# Patient Record
Sex: Female | Born: 1970
Health system: Southern US, Community
[De-identification: ages and names within clinical notes are randomized; demographics above are authoritative.]

## PROBLEM LIST (undated history)

## (undated) DIAGNOSIS — K5900 Constipation, unspecified: Secondary | ICD-10-CM

## (undated) DIAGNOSIS — I10 Essential (primary) hypertension: Secondary | ICD-10-CM

## (undated) DIAGNOSIS — Z5189 Encounter for other specified aftercare: Secondary | ICD-10-CM

## (undated) DIAGNOSIS — M2550 Pain in unspecified joint: Secondary | ICD-10-CM

## (undated) HISTORY — PX: MYOMECTOMY: SHX85

## (undated) HISTORY — DX: Essential (primary) hypertension: I10

## (undated) HISTORY — DX: Pain in unspecified joint: M25.50

## (undated) HISTORY — PX: PARTIAL HYSTERECTOMY: SHX80

## (undated) HISTORY — DX: Constipation, unspecified: K59.00

## (undated) HISTORY — DX: Encounter for other specified aftercare: Z51.89

## (undated) HISTORY — PX: ABDOMINAL HYSTERECTOMY: SHX81

---

## 2016-01-18 ENCOUNTER — Encounter: Payer: Self-pay | Admitting: Obstetrics and Gynecology

## 2019-04-07 ENCOUNTER — Telehealth: Payer: Self-pay

## 2019-04-07 NOTE — Telephone Encounter (Signed)
Message from answering service-Pt bp 160/100. Pt unsure why. Please advise.//ah

## 2019-04-07 NOTE — Telephone Encounter (Signed)
She is new and I left message to front to schedule visit

## 2019-04-18 ENCOUNTER — Encounter: Payer: Self-pay | Admitting: Cardiology

## 2019-04-18 ENCOUNTER — Ambulatory Visit (INDEPENDENT_AMBULATORY_CARE_PROVIDER_SITE_OTHER): Payer: 59 | Admitting: Cardiology

## 2019-04-18 ENCOUNTER — Other Ambulatory Visit: Payer: Self-pay

## 2019-04-18 VITALS — BP 168/106 | HR 77 | Ht 66.5 in | Wt 225.6 lb

## 2019-04-18 DIAGNOSIS — Z6835 Body mass index (BMI) 35.0-35.9, adult: Secondary | ICD-10-CM

## 2019-04-18 DIAGNOSIS — I10 Essential (primary) hypertension: Secondary | ICD-10-CM | POA: Diagnosis not present

## 2019-04-18 DIAGNOSIS — E6609 Other obesity due to excess calories: Secondary | ICD-10-CM

## 2019-04-18 MED ORDER — CHLORTHALIDONE 25 MG PO TABS: 25.0000 mg | ORAL_TABLET | ORAL | 2 refills | Status: DC

## 2019-04-18 MED ORDER — AMLODIPINE BESYLATE 10 MG PO TABS: 10.0000 mg | ORAL_TABLET | Freq: Every day | ORAL | 2 refills | Status: DC

## 2019-04-18 NOTE — Progress Notes (Signed)
Primary Physician/Referring:  Leilani Able, MD  Patient ID: Amanda Stevens, female    DOB: 09/25/1970, 48 y.o.   MRN: [id number]  Chief Complaint  Patient presents with  . Hypertension   HPI:    Amanda Stevens  is a 48 y.o. African-American female with hypertension,At age 48 had hysterectomy, 4 years following which she started gaining weight and also started noticing blood pressure to be elevated.  Recently her blood pressure has not been well-controlled and much more elevated than before, has also noticed fatigue and also occasional episodes of headache with elevated blood pressure.  She is now referred to me for evaluation of the same.  She continues to exercise, is trying her best to watch the diet and still has difficulty in losing weight.  She has no chest pain, dyspnea, palpitations, leg edema.  Past Medical History:  Diagnosis Date  . Hypertension    Past Surgical History:  Procedure Laterality Date  . PARTIAL HYSTERECTOMY     Social History   Socioeconomic History  . Marital status: Single    Spouse name: Not on file  . Number of children: Not on file  . Years of education: Not on file  . Highest education level: Not on file  Occupational History  . Not on file  Social Needs  . Financial resource strain: Not on file  . Food insecurity    Worry: Not on file    Inability: Not on file  . Transportation needs    Medical: Not on file    Non-medical: Not on file  Tobacco Use  . Smoking status: Never Smoker  . Smokeless tobacco: Never Used  Substance and Sexual Activity  . Alcohol use: Not Currently  . Drug use: Not on file  . Sexual activity: Not on file  Lifestyle  . Physical activity    Days per week: Not on file    Minutes per session: Not on file  . Stress: Not on file  Relationships  . Social Musician on phone: Not on file    Gets together: Not on file    Attends religious service: Not on file    Active member of club or organization: Not  on file    Attends meetings of clubs or organizations: Not on file    Relationship status: Not on file  . Intimate partner violence    Fear of current or ex partner: Not on file    Emotionally abused: Not on file    Physically abused: Not on file    Forced sexual activity: Not on file  Other Topics Concern  . Not on file  Social History Narrative  . Not on file   ROS  Review of Systems  Constitution: Negative for chills, decreased appetite, malaise/fatigue and weight gain.  Cardiovascular: Negative for dyspnea on exertion, leg swelling and syncope.  Respiratory: Positive for snoring (no other sympotms of sleep apnea).   Endocrine: Negative for cold intolerance.  Hematologic/Lymphatic: Does not bruise/bleed easily.  Musculoskeletal: Negative for joint swelling.  Gastrointestinal: Negative for abdominal pain, anorexia, change in bowel habit, hematochezia and melena.  Neurological: Positive for headaches (occasionally with elevated BP). Negative for light-headedness.  Psychiatric/Behavioral: Negative for depression and substance abuse.  All other systems reviewed and are negative.  Objective   Vitals with BMI 04/18/2019  Height 5' 6.5"  Weight 225 lbs 10 oz  BMI 35.87  Systolic 168  Diastolic 106  Pulse 77     Physical  Exam  Constitutional:  She is well-developed and moderately obese in no acute distress.  HENT:  Head: Atraumatic.  Eyes: Conjunctivae are normal.  Neck: Neck supple. No JVD present. No thyromegaly present.  Cardiovascular: Normal rate, regular rhythm, normal heart sounds and intact distal pulses. Exam reveals no gallop.  No murmur heard. No leg edema, no JVD.  Pulmonary/Chest: Effort normal and breath sounds normal.  Abdominal: Soft. Bowel sounds are normal.  Musculoskeletal: Normal range of motion.  Neurological: She is alert.  Skin: Skin is warm and dry.  Psychiatric: She has a normal mood and affect.   Laboratory examination:    No results for  input(s): NA, K, CL, CO2, GLUCOSE, BUN, CREATININE, CALCIUM, GFRNONAA, GFRAA in the last 8760 hours. CrCl cannot be calculated (No successful lab value found.).  No flowsheet data found. No flowsheet data found. Lipid Panel  No results found for: CHOL, TRIG, HDL, CHOLHDL, VLDL, LDLCALC, LDLDIRECT HEMOGLOBIN A1C No results found for: HGBA1C, MPG TSH No results for input(s): TSH in the last 8760 hours. Medications and allergies  No Known Allergies   Current Outpatient Medications  Medication Instructions  . amLODipine (NORVASC) 10 mg, Oral, Daily  . Black Currant Seed Oil 500 MG CAPS Oral  . chlorthalidone (HYGROTON) 25 mg, Oral, BH-each morning  . EDARBI 80 MG TABS 1 tablet, Oral, Daily  . multivitamin-iron-minerals-folic acid (CENTRUM) chewable tablet 1 tablet, Oral, Daily  . Omega-3 1000 MG CAPS Oral  . Progesterone Micronized (PROGESTERONE COMPOUNDING KIT) 20 % CREA Transdermal    Radiology:  No results found. Cardiac Studies:   None  Assessment     ICD-10-CM   1. Essential hypertension  I10 EKG 12-Lead    amLODipine (NORVASC) 10 MG tablet    PCV ECHOCARDIOGRAM COMPLETE  2. Class 2 obesity due to excess calories without serious comorbidity with body mass index (BMI) of 35.0 to 35.9 in adult  E66.09    Z68.35     EKG 04/18/2019: Normal sinus rhythm at rate of 71 bpm, left atrial enlargement, otherwise normal EKG.    Recommendations:   Meds ordered this encounter  Medications  . amLODipine (NORVASC) 10 MG tablet    Sig: Take 1 tablet (10 mg total) by mouth daily.    Dispense:  30 tablet    Refill:  2  . chlorthalidone (HYGROTON) 25 MG tablet    Sig: Take 1 tablet (25 mg total) by mouth every morning.    Dispense:  30 tablet    Refill:  2    Patient referred to me by Dr. Haskel Schroeder for evaluation of hypertension.  Patient's blood pressure issues started after hysterectomy, she is also gained significant amount of weight since then over the past 5 years or so.   Her symptoms of headaches and fatigue are related to uncontrolled hypertension.  Advised her to continue [place] but I have added chlorthalidone 25 mg once a day in the morning along with amlodipine 10 mg in the afternoon.  We will obtain an echocardiogram to evaluate LV systolic function and to look for LVH.  She has got moderate obesity.  I had a lengthy and extensive discussion regarding modification in diet, DASH diet discussed, Vegan diet also discussed.  I'll obtain a BMP in 2 weeks.  I'll like to see her back in 6 weeks for follow-up.  She'll also bring me all the labs from her PCP including lipids that was done at the workplace. She will certainly need lipid profile testing and  TSH if not recently done.  Yates Decamp, MD, [facility] 04/18/2019, 2:04 PM [facility] Pager: [phone] Office: [phone] If no answer Cell [phone]

## 2019-04-18 NOTE — Patient Instructions (Signed)
Please have blood work done in 2 weeks, go to any [facility], orders were already in.  2 medications have been started, chlorthalidone 25 mg in the morning once a day and amlodipine 10 mg one tablet once a day.  Echocardiogram has been ordered and I'll see her back in 6 weeks.  Please follow with diet recommendations are made.

## 2019-05-12 ENCOUNTER — Telehealth: Payer: Self-pay | Admitting: Cardiology

## 2019-05-12 ENCOUNTER — Other Ambulatory Visit: Payer: 59

## 2019-05-19 ENCOUNTER — Telehealth: Payer: Self-pay

## 2019-05-19 NOTE — Telephone Encounter (Signed)
Patient called and said that she is having significant fatigue, she believes it is from the Amlodipine. She takes all 3 of her BP meds at the same time in the morning. She also has some swelling in her feet.

## 2019-05-19 NOTE — Telephone Encounter (Signed)
She can split taking medication and also to reduce Amlodipine to 1/2 and take in evening

## 2019-05-26 ENCOUNTER — Ambulatory Visit: Payer: 59 | Admitting: Cardiology

## 2019-06-09 ENCOUNTER — Other Ambulatory Visit: Payer: 59

## 2019-07-09 ENCOUNTER — Other Ambulatory Visit: Payer: Self-pay

## 2019-07-09 DIAGNOSIS — I10 Essential (primary) hypertension: Secondary | ICD-10-CM

## 2019-07-09 MED ORDER — CHLORTHALIDONE 25 MG PO TABS: 25.0000 mg | ORAL_TABLET | ORAL | 1 refills | Status: DC

## 2019-07-09 MED ORDER — AMLODIPINE BESYLATE 10 MG PO TABS: 10.0000 mg | ORAL_TABLET | Freq: Every day | ORAL | 1 refills | Status: DC

## 2019-09-01 ENCOUNTER — Ambulatory Visit: Payer: 59 | Admitting: Cardiology

## 2019-09-01 ENCOUNTER — Encounter: Payer: Self-pay | Admitting: Cardiology

## 2019-09-01 ENCOUNTER — Other Ambulatory Visit: Payer: Self-pay

## 2019-09-01 VITALS — BP 126/77 | HR 97 | Temp 98.2°F | Resp 18 | Ht 66.0 in | Wt 237.0 lb

## 2019-09-01 DIAGNOSIS — I10 Essential (primary) hypertension: Secondary | ICD-10-CM

## 2019-09-01 NOTE — Progress Notes (Signed)
Primary Physician/Referring:  Leilani Able, MD  Patient ID: Amanda Stevens, female    DOB: 1970/10/13, 49 y.o.   MRN: [id number]  Chief Complaint  Patient presents with  . Hypertension  . Follow-up   HPI:    Amanda Stevens  is a 49 y.o. African-American female with hypertension,At age 49 had hysterectomy, 49 years following which she started gaining weight and also started noticing blood pressure to be elevated.  Due to difficulty in blood pressure control she was referred back to me, I had started on chlorthalidone and also amlodipine which she is tolerating.  She now presents for follow-up and remains asymptomatic.  She has noticed significant improvement in blood pressure.  Past Medical History:  Diagnosis Date  . Hypertension    Past Surgical History:  Procedure Laterality Date  . PARTIAL HYSTERECTOMY     Social History   Socioeconomic History  . Marital status: Single    Spouse name: Not on file  . Number of children: Not on file  . Years of education: Not on file  . Highest education level: Not on file  Occupational History  . Not on file  Tobacco Use  . Smoking status: Never Smoker  . Smokeless tobacco: Never Used  Substance and Sexual Activity  . Alcohol use: Not Currently  . Drug use: Not on file  . Sexual activity: Not on file  Other Topics Concern  . Not on file  Social History Narrative  . Not on file   Social Determinants of Health   Financial Resource Strain:   . Difficulty of Paying Living Expenses:   Food Insecurity:   . Worried About Programme researcher, broadcasting/film/video in the Last Year:   . Barista in the Last Year:   Transportation Needs:   . Freight forwarder (Medical):   Marland Kitchen Lack of Transportation (Non-Medical):   Physical Activity:   . Days of Exercise per Week:   . Minutes of Exercise per Session:   Stress:   . Feeling of Stress :   Social Connections:   . Frequency of Communication with Friends and Family:   . Frequency of Social  Gatherings with Friends and Family:   . Attends Religious Services:   . Active Member of Clubs or Organizations:   . Attends Banker Meetings:   Marland Kitchen Marital Status:   Intimate Partner Violence:   . Fear of Current or Ex-Partner:   . Emotionally Abused:   Marland Kitchen Physically Abused:   . Sexually Abused:    ROS  Review of Systems  Cardiovascular: Negative for chest pain, dyspnea on exertion and leg swelling.  Gastrointestinal: Negative for melena.   Objective   Vitals with BMI 09/01/2019 04/18/2019  Height 5\' 6"  5' 6.5"  Weight 237 lbs 225 lbs 10 oz  BMI 38.27 35.87  Systolic 126 168  Diastolic 77 106  Pulse 97 77     Physical Exam  Constitutional:  She is well-developed and moderately obese in no acute distress.  Neck: No JVD present. No thyromegaly present.  Cardiovascular: Normal rate, regular rhythm, normal heart sounds and intact distal pulses. Exam reveals no gallop.  No murmur heard. No leg edema, no JVD.  Pulmonary/Chest: Effort normal and breath sounds normal.  Abdominal: Soft. Bowel sounds are normal.  Musculoskeletal:     Cervical back: Neck supple.   Laboratory examination:   No results for input(s): NA, K, CL, CO2, GLUCOSE, BUN, CREATININE, CALCIUM, GFRNONAA, GFRAA in the last 8760  hours. CrCl cannot be calculated (No successful lab value found.).  No flowsheet data found. No flowsheet data found. Lipid Panel  No results found for: CHOL, TRIG, HDL, CHOLHDL, VLDL, LDLCALC, LDLDIRECT HEMOGLOBIN A1C No results found for: HGBA1C, MPG TSH No results for input(s): TSH in the last 8760 hours. Medications and allergies  No Known Allergies   Current Outpatient Medications  Medication Instructions  . amLODipine (NORVASC) 10 mg, Oral, Daily  . Black Currant Seed Oil 500 MG CAPS Oral  . chlorthalidone (HYGROTON) 25 mg, Oral, BH-each morning  . EDARBI 80 MG TABS 1 tablet, Oral, Daily  . multivitamin-iron-minerals-folic acid (CENTRUM) chewable tablet 1  tablet, Oral, Daily  . Omega-3 1000 MG CAPS Oral  . Progesterone Micronized (PROGESTERONE COMPOUNDING KIT) 20 % CREA Transdermal    Radiology:  No results found. Cardiac Studies:   None  Assessment     ICD-10-CM   1. Essential hypertension  I10     EKG 04/18/2019: Normal sinus rhythm at rate of 71 bpm, left atrial enlargement, otherwise normal EKG.    Recommendations:   No orders of the defined types were placed in this encounter.   Patient referred to me by Dr. Haskel Schroeder for evaluation of hypertension.  Since addition of amlodipine and also chlorthalidone to her [name], patient blood pressure is not very well controlled, she is also made significant lifestyle changes and has started to lose weight.  No change in his physical exam.  I did order an echocardiogram she has not kept of the appointment for the same, in view of the fact that she is asymptomatic and blood pressures well controlled we will cancel this.  I will see her back on a as needed basis.  Positive reinforcement given regarding weight loss.  Advised her to follow-up with Dr. Haskel Schroeder for further management and evaluation of blood pressure and primary care needs.  Yates Decamp, MD, [facility] 09/01/2019, 3:47 PM [facility] Pager: [phone] Office: [phone] If no answer Cell [phone]

## 2020-01-02 ENCOUNTER — Other Ambulatory Visit: Payer: Self-pay | Admitting: Cardiology

## 2020-03-04 ENCOUNTER — Other Ambulatory Visit: Payer: Self-pay

## 2020-03-04 DIAGNOSIS — I10 Essential (primary) hypertension: Secondary | ICD-10-CM

## 2020-03-04 MED ORDER — CHLORTHALIDONE 25 MG PO TABS: 25.0000 mg | ORAL_TABLET | Freq: Every day | ORAL | 1 refills | Status: DC

## 2020-03-04 MED ORDER — AMLODIPINE BESYLATE 10 MG PO TABS: 10.0000 mg | ORAL_TABLET | Freq: Every day | ORAL | 1 refills | Status: DC

## 2020-03-18 ENCOUNTER — Ambulatory Visit
Admission: RE | Admit: 2020-03-18 | Discharge: 2020-03-18 | Disposition: A | Discharge status: Home / Self Care | Payer: 59 | Source: Ambulatory Visit | Attending: Family Medicine | Admitting: Family Medicine

## 2020-03-18 ENCOUNTER — Other Ambulatory Visit: Payer: Self-pay | Admitting: Family Medicine

## 2020-03-18 DIAGNOSIS — M545 Low back pain, unspecified: Secondary | ICD-10-CM

## 2020-07-13 ENCOUNTER — Other Ambulatory Visit: Payer: Self-pay | Admitting: Gastroenterology

## 2020-07-13 DIAGNOSIS — R1032 Left lower quadrant pain: Secondary | ICD-10-CM

## 2020-07-16 ENCOUNTER — Ambulatory Visit
Admission: RE | Admit: 2020-07-16 | Discharge: 2020-07-16 | Disposition: A | Discharge status: Home / Self Care | Payer: 59 | Source: Ambulatory Visit | Attending: Gastroenterology | Admitting: Gastroenterology

## 2020-07-16 ENCOUNTER — Other Ambulatory Visit: Payer: Self-pay

## 2020-07-16 DIAGNOSIS — R1032 Left lower quadrant pain: Secondary | ICD-10-CM

## 2020-07-16 MED ORDER — IOPAMIDOL (ISOVUE-300) INJECTION 61%
100.0000 mL | Freq: Once | INTRAVENOUS | Status: AC | PRN
  Administered 2020-07-16: 100 mL via INTRAVENOUS

## 2020-07-21 DIAGNOSIS — N951 Menopausal and female climacteric states: Secondary | ICD-10-CM | POA: Insufficient documentation

## 2020-07-21 DIAGNOSIS — Z90710 Acquired absence of both cervix and uterus: Secondary | ICD-10-CM | POA: Insufficient documentation

## 2020-07-21 DIAGNOSIS — R1032 Left lower quadrant pain: Secondary | ICD-10-CM | POA: Insufficient documentation

## 2020-07-28 ENCOUNTER — Other Ambulatory Visit: Payer: 59

## 2020-12-08 IMAGING — CR DG LUMBAR SPINE COMPLETE 4+V
5 series · 5 of 5 positions shown · non-contrast
Comparison: None.

CLINICAL DATA: Low back and right leg pain without known injury.

EXAM:
LUMBAR SPINE - COMPLETE 4+ VIEW

[t l-spine a.p.]
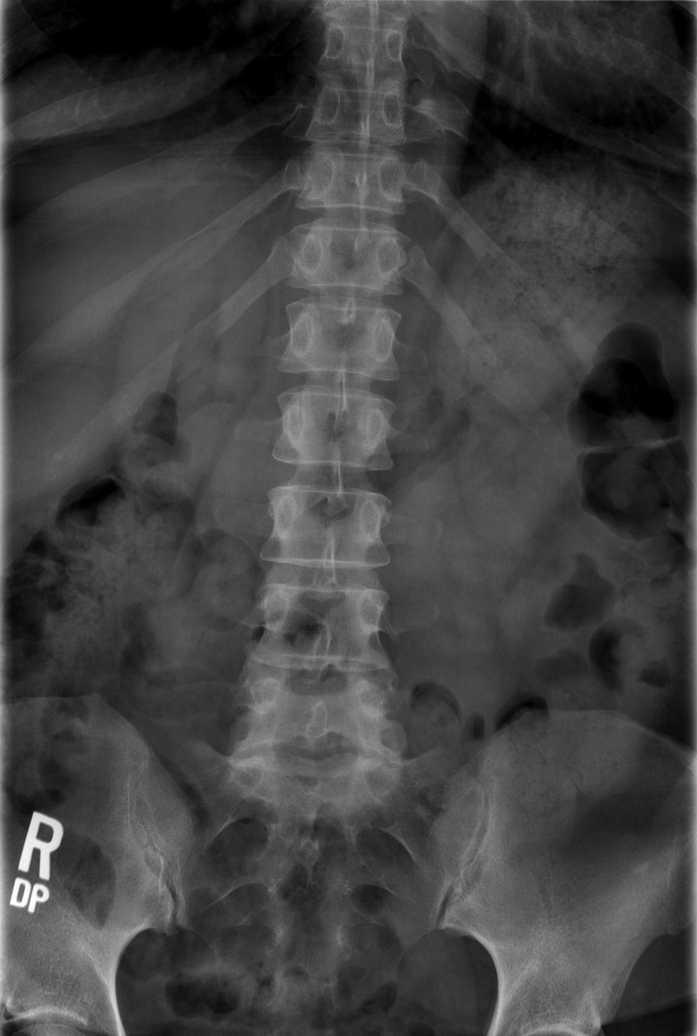

[t l-spine oblique exposure (1 of 2)]
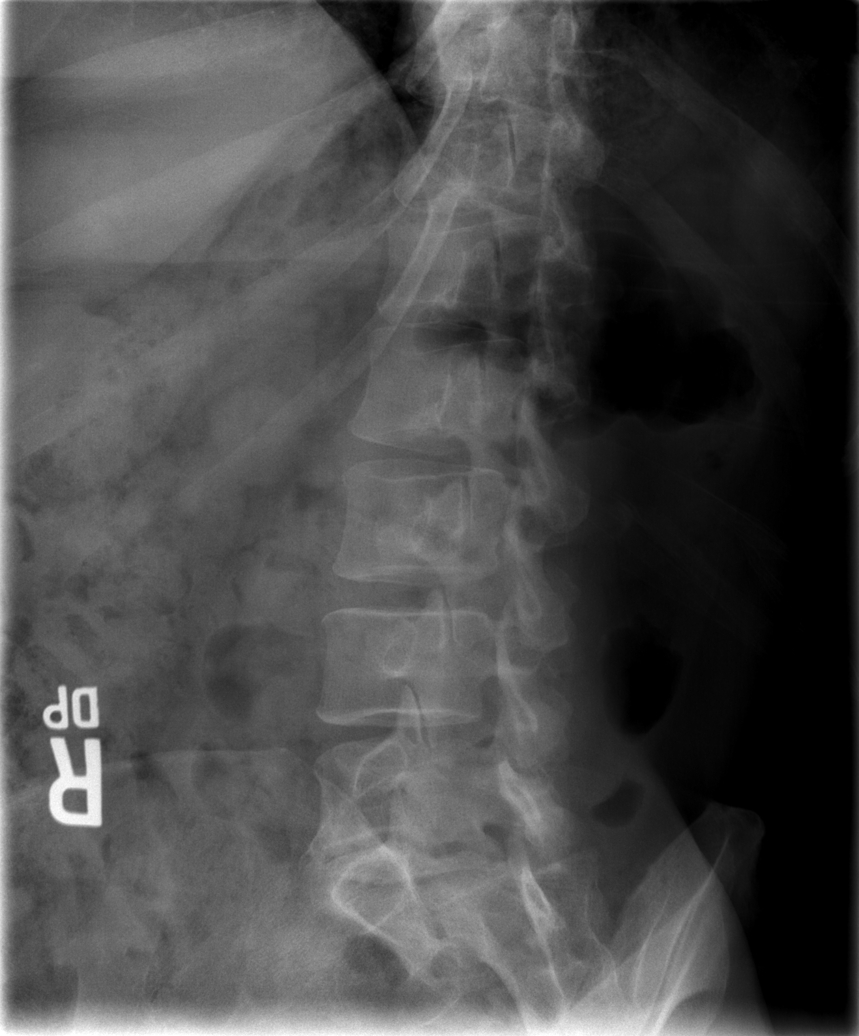

[t l-spine oblique exposure (2 of 2)]
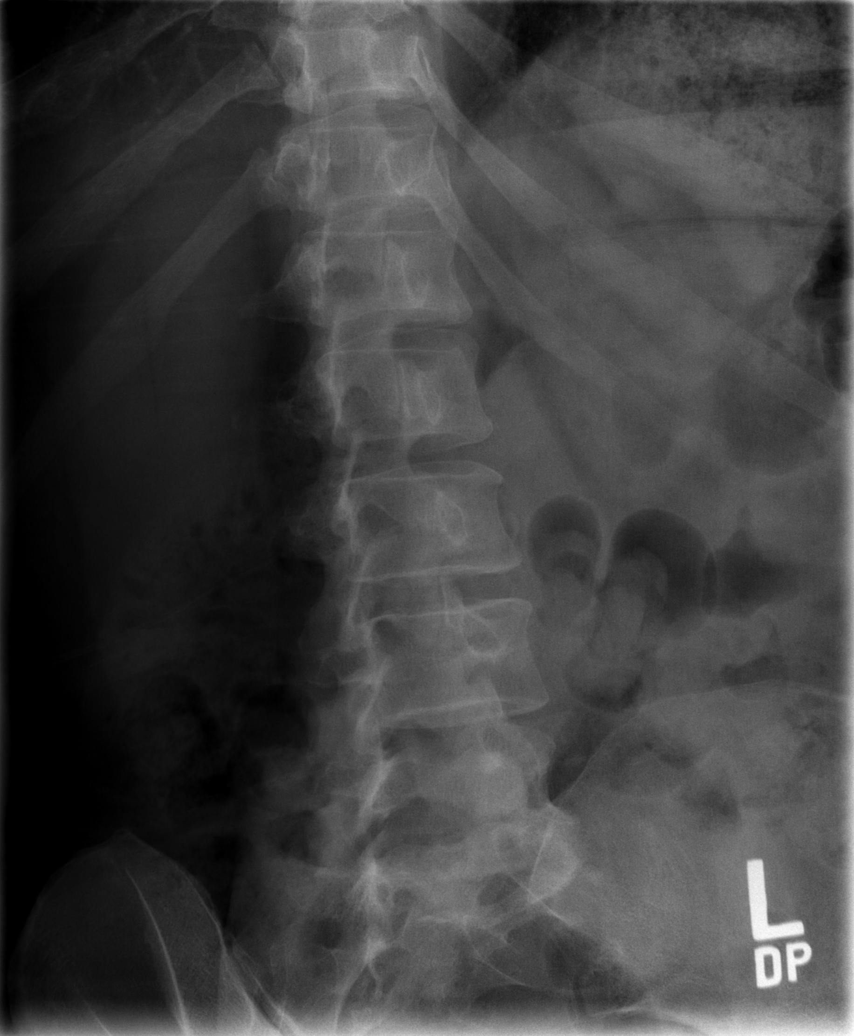

[t l-spine lat]
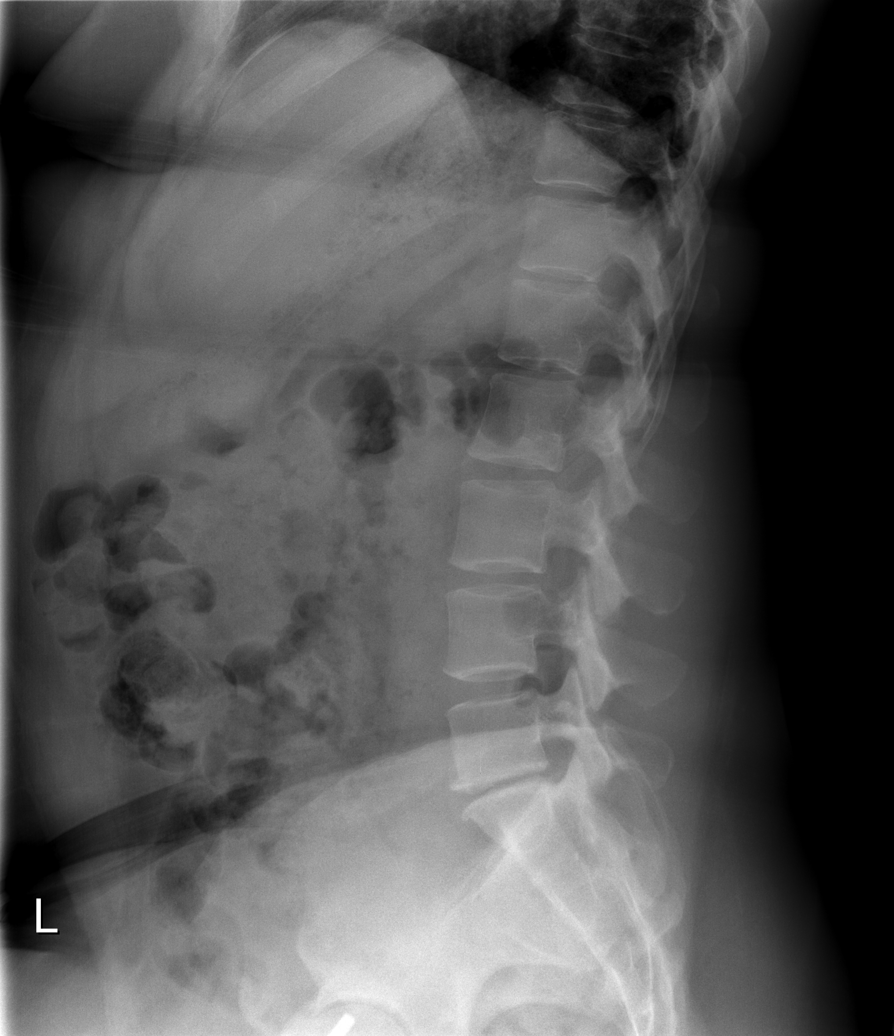

[t l-spine l5-s1 spot]
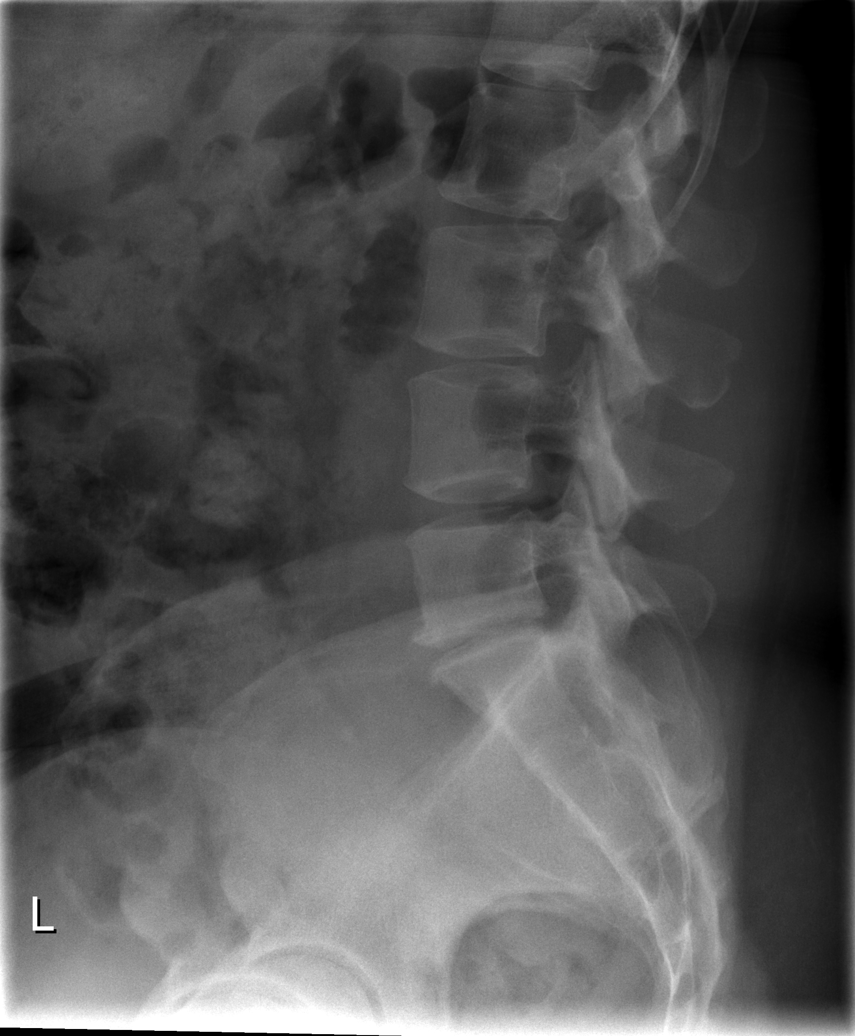

[5 of 5 positions shown; findings below may reference images not displayed]

FINDINGS: No fracture or spondylolisthesis is noted. Moderate degenerative
disc disease is noted at L5-S1. Remaining disc spaces are
unremarkable.
IMPRESSION: Moderate degenerative disc disease is noted at L5-S1. No acute
abnormality is noted.

## 2021-01-20 ENCOUNTER — Encounter: Payer: Self-pay | Admitting: Cardiology

## 2021-05-20 DIAGNOSIS — N951 Menopausal and female climacteric states: Secondary | ICD-10-CM | POA: Insufficient documentation

## 2022-05-04 LAB — BASIC METABOLIC PANEL
BUN: 19 (ref 4–21)
CO2: 22 (ref 13–22)
Chloride: 110 — AB (ref 99–108)
Creatinine: 1.4 — AB (ref 0.5–1.1)
Glucose: 89
Potassium: 4.4 mEq/L (ref 3.5–5.1)
Sodium: 141 (ref 137–147)

## 2022-05-04 LAB — HEPATIC FUNCTION PANEL
ALT: 33 U/L (ref 7–35)
AST: 28 (ref 13–35)
Alkaline Phosphatase: 57 (ref 25–125)
Bilirubin, Total: 0.5

## 2022-05-04 LAB — LIPID PANEL
Cholesterol: 222 — AB (ref 0–200)
HDL: 65 (ref 35–70)
LDL Cholesterol: 131
Triglycerides: 149 (ref 40–160)

## 2022-05-04 LAB — COMPREHENSIVE METABOLIC PANEL
Albumin: 4.2 (ref 3.5–5.0)
Calcium: 9.6 (ref 8.7–10.7)
Globulin: 3
eGFR: 45

## 2022-05-04 LAB — CBC AND DIFFERENTIAL
HCT: 42 (ref 36–46)
Hemoglobin: 13.9 (ref 12.0–16.0)
Neutrophils Absolute: 3561
Platelets: 333 10*3/uL (ref 150–400)
WBC: 5.8

## 2022-05-04 LAB — CBC: RBC: 5.23 — AB (ref 3.87–5.11)

## 2022-05-04 LAB — TSH: TSH: 1.75 (ref 0.41–5.90)

## 2022-05-04 LAB — HEMOGLOBIN A1C: Hemoglobin A1C: 5.8

## 2022-05-10 ENCOUNTER — Ambulatory Visit: Payer: 59 | Admitting: Family Medicine

## 2022-06-05 ENCOUNTER — Ambulatory Visit (INDEPENDENT_AMBULATORY_CARE_PROVIDER_SITE_OTHER): Payer: 59 | Admitting: Family Medicine

## 2022-06-05 ENCOUNTER — Encounter: Payer: Self-pay | Admitting: Family Medicine

## 2022-06-05 VITALS — BP 118/74 | HR 95 | Temp 97.3°F | Ht 66.0 in | Wt 237.8 lb

## 2022-06-05 DIAGNOSIS — Z6838 Body mass index (BMI) 38.0-38.9, adult: Secondary | ICD-10-CM | POA: Diagnosis not present

## 2022-06-05 DIAGNOSIS — R7303 Prediabetes: Secondary | ICD-10-CM | POA: Diagnosis not present

## 2022-06-05 DIAGNOSIS — I10 Essential (primary) hypertension: Secondary | ICD-10-CM | POA: Diagnosis not present

## 2022-06-05 DIAGNOSIS — E6609 Other obesity due to excess calories: Secondary | ICD-10-CM

## 2022-06-05 MED ORDER — AMLODIPINE BESYLATE 10 MG PO TABS: 10.0000 mg | ORAL_TABLET | Freq: Every day | ORAL | 1 refills | Status: DC

## 2022-06-05 NOTE — Patient Instructions (Signed)
Welcome to [facility]! It was a pleasure meeting you today.  As discussed, Please schedule a 1 month follow up visit today.  Take amlodipine daily.  Monitor blood pressures  PLEASE NOTE:  If you had any LAB tests please let us know if you have not heard back within a few days. You may see your results on MyChart before we have a chance to review them but we will give you a call once they are reviewed by [place]. If we ordered any REFERRALS today, please let us know if you have not heard from their office within the next week.  Let us know through MyChart if you are needing REFILLS, or have your pharmacy send [place] the request. You can also use MyChart to communicate with me or any office staff.  Please try these tips to maintain a healthy lifestyle:  Eat most of your calories during the day when you are active. Eliminate processed foods including packaged sweets (pies, cakes, cookies), reduce intake of potatoes, [place] bread, Sane pasta, and [place] rice. Look for whole grain options, oat flour or almond flour.  Each meal should contain half fruits/vegetables, one quarter protein, and one quarter carbs (no bigger than a computer mouse).  Cut down on sweet beverages. This includes juice, soda, and sweet tea. Also watch fruit intake, though this is a healthier sweet option, it still contains natural sugar! Limit to 3 servings daily.  Drink at least 1 glass of water with each meal and aim for at least 8 glasses per day  Exercise at least 150 minutes every week.

## 2022-06-05 NOTE — Progress Notes (Signed)
New Patient Office Visit  Subjective:  Patient ID: Amanda Stevens, female    DOB: 10-16-1970  Age: 52 y.o. MRN: [id number]  CC:  Chief Complaint  Patient presents with   New Patient (Initial Visit)    Pt has all records from last pcp.     HPI Amanda Stevens presents for new pt htn   Hates meds  HTN-Pt is supposed to be on amlodipine, chlorthal and edarbi.  Doesn't take daily as "feel sick" on them-took all 3 meds 2 days ago. Trying to be more consistent.  Bp's running-not checking  .  No ha/dizziness/cp/palp/edema/cough/sob.  Did labs in Dec.   Fell in parking lot last wk.  Was exercising.    Twice, got really hot after going to gym and then BM and better.  Does some intermitt fasting-eats from noon-8.   preDM-doing smoothies, green shots, etc.  Has done plant based diet, meats, etc. Can't lose wt.  Exercising. Ozempic 0.25mg  in past and not lose wt.  Has done on-line plans for nutrition.  No meds for wt loss in past.  Past Medical History:  Diagnosis Date   Blood transfusion without reported diagnosis 2013   I had a hysterectomy and lost blood   Hypertension     Past Surgical History:  Procedure Laterality Date   ABDOMINAL HYSTERECTOMY  2013   fibroids   MYOMECTOMY     PARTIAL HYSTERECTOMY      Family History  Problem Relation Age of Onset   Hypertension Mother    Liver cancer Mother    Hypertension Father    Depression Father    Vision loss Father    Obesity Sister     Social History   Socioeconomic History   Marital status: Single    Spouse name: Not on file   Number of children: 0   Years of education: Not on file   Highest education level: Not on file  Occupational History   Occupation: leader    Employer: [employer] copy[employer]  Tobacco Use   Smoking status: Never   Smokeless tobacco: Never  Vaping Use   Vaping Use: Never used  Substance and Sexual Activity   Alcohol use: Never   Drug use: Never   Sexual activity: Not Currently    Birth  control/protection: None  Other Topics Concern   Not on file  Social History Narrative   ** Merged History Encounter **       Social Determinants of Health   Financial Resource Strain: Not on file  Food Insecurity: Not on file  Transportation Needs: Not on file  Physical Activity: Not on file  Stress: Not on file  Social Connections: Not on file  Intimate Partner Violence: Not on file    ROS  ROS: Gen: no fever, chills  Skin: no rash, itching ENT: no ear pain, ear drainage, nasal congestion, rhinorrhea, sinus pressure, sore throat Eyes: no blurry vision, double vision Resp: no cough, wheeze,SOB CV: no CP, palpitations, LE edema,  GI: no heartburn, n/v/d/c, abd pain GU: no dysuria, urgency, frequency, hematuria MSK: no joint pain, myalgias, back pain Neuro: no dizziness, headache, weakness, vertigo Psych: no depression, anxiety, insomnia, SI    Objective:   Today's Vitals: BP 118/74 (BP Location: Right Arm, Patient Position: Sitting)   Pulse 95   Temp (!) 97.3 F (36.3 C) (Temporal)   Ht 5\' 6"  (1.676 m)   Wt 237 lb 12.8 oz (107.9 kg)   LMP  (LMP Unknown)   SpO2  98%   BMI 38.38 kg/m   Physical Exam  Gen: WDWN NAD OAAF HEENT: NCAT, conjunctiva not injected, sclera nonicteric NECK:  supple, no thyromegaly, no nodes, no carotid bruits CARDIAC: RRR, S1S2+, no murmur. DP 2+B LUNGS: CTAB. No wheezes ABDOMEN:  BS+, soft, NTND, No HSM, no masses EXT:  no edema MSK: no gross abnormalities.  NEURO: A&O x3.  CN II-XII intact.  PSYCH: normal mood. Good eye contact   Being referred to neph for inc creat.  Seen 05/06/22 by PCP and labs done.    Assessment & Plan:   Problem List Items Addressed This Visit       Cardiovascular and Mediastinum   Essential hypertension   Relevant Medications   amLODipine (NORVASC) 10 MG tablet     Other   Prediabetes - Primary   Other Visit Diagnoses     Class 2 obesity due to excess calories with body mass index (BMI) of 38.0  to 38.9 in adult, unspecified whether serious comorbidity present         1.  Hypertension-chronic.  Controlled here, however has been elevated.  Patient's not really checking.  She has not been compliant with taking her medications.  She is exercising.  Working on diet.  There is a strong hereditary component.  I am not sure she needs 3 medicines to control her blood pressures versus consistently taking medications.  Due to some CKD, will try to avoid diuretics.  As I do not have access to labs currently, we will also hold on ARB.  For now, take amlodipine 10 mg daily consistently and check blood pressures at least every few days.  If blood pressures are elevating, let us know.  Follow-up in 1 month.  Will get copy of labs. 2.  Prediabetes-chronic.  Aware of risk of diabetes.  Patient has been working on diet/exercise but not losing weight.  Understandably, this is very frustrating to her.  Discussed metformin, meds.  She would like to avoid meds if possible.  Advised to continue on her journey.  Ozempic has been prescribed by previous PCP, however patient still has not received.  Advised that it may be going through prior Auth, rejected, other.  Patient will follow-up with them. 3.  Obesity with comorbidities.  She is struggling with weight loss despite diet/exercise.  She has been using online nutritions and plans.  We discussed Wegovy/Mounjaro.  She would prefer not to be on medications.  She is to check with her previous PCPs on status of Ozempic.  Follow-up in 4 weeks.  Outpatient Encounter Medications as of 06/05/2022  Medication Sig   Black Currant Seed Oil 500 MG CAPS Take by mouth.   chlorthalidone (HYGROTON) 25 MG tablet Take 1 tablet (25 mg total) by mouth daily.   EDARBI 80 MG TABS Take 1 tablet by mouth daily.   multivitamin-iron-minerals-folic acid (CENTRUM) chewable tablet Chew 1 tablet by mouth daily.   Omega-3 1000 MG CAPS Take by mouth.   Progesterone Micronized (PROGESTERONE  COMPOUNDING KIT) 20 % CREA Place onto the skin.   amLODipine (NORVASC) 10 MG tablet Take 1 tablet (10 mg total) by mouth daily.   [DISCONTINUED] amLODipine (NORVASC) 10 MG tablet Take 1 tablet (10 mg total) by mouth daily.   No facility-administered encounter medications on file as of 06/05/2022.    Follow-up: Return in about 4 weeks (around 07/03/2022) for HTN-.   Angelena Sole, MD

## 2022-06-16 ENCOUNTER — Telehealth: Payer: Self-pay | Admitting: Family Medicine

## 2022-06-16 NOTE — Telephone Encounter (Signed)
Patient requests to be called at ph# [phone] regarding:  Patient states her New Patient Appointment on 2021-06-14 should have been coded as a Physical and questions regarding future coding for appointments.

## 2022-06-21 ENCOUNTER — Telehealth: Payer: Self-pay | Admitting: Family Medicine

## 2022-06-21 NOTE — Telephone Encounter (Signed)
Patient states; -PCP wanted to see past medical history to assess lab work to see kidney levels and A1c  - She has contacted Dr. Zenia Resides, former PCP, office in regards to old abnormal kidney results per Dr. Kathrin Ruddy recommendation but hasn't heard back   I was able to confirm with patient that the medical records from [facility] practice has been received. Pt is requesting a callback for next steps.

## 2022-06-22 NOTE — Telephone Encounter (Signed)
Tried calling office twice with no answer, faxed form requesting lab results.

## 2022-06-27 ENCOUNTER — Encounter: Payer: Self-pay | Admitting: Family Medicine

## 2022-07-06 NOTE — Telephone Encounter (Signed)
Patient requests to be called regarding message below.

## 2022-07-10 ENCOUNTER — Ambulatory Visit: Payer: 59 | Admitting: Family Medicine

## 2022-07-12 LAB — COMPREHENSIVE METABOLIC PANEL
Albumin: 4.5 (ref 3.5–5.0)
Calcium: 9.6 (ref 8.7–10.7)
Globulin: 2.6
eGFR: 58

## 2022-07-12 LAB — HEPATIC FUNCTION PANEL
ALT: 39 U/L — AB (ref 7–35)
AST: 30 (ref 13–35)
Alkaline Phosphatase: 69 (ref 25–125)
Bilirubin, Total: 0.4

## 2022-07-12 LAB — BASIC METABOLIC PANEL
BUN: 15 (ref 4–21)
CO2: 23 — AB (ref 13–22)
Chloride: 104 (ref 99–108)
Creatinine: 1.1 (ref 0.5–1.1)
Glucose: 100
Potassium: 4.4 mEq/L (ref 3.5–5.1)
Sodium: 137 (ref 137–147)

## 2022-07-12 LAB — CBC AND DIFFERENTIAL
HCT: 41 (ref 36–46)
Hemoglobin: 13.7 (ref 12.0–16.0)
Platelets: 288 10*3/uL (ref 150–400)
WBC: 5.6

## 2022-07-12 LAB — CBC: RBC: 5.2 — AB (ref 3.87–5.11)

## 2022-07-17 LAB — LAB REPORT - SCANNED
Albumin, Urine POC: <3
Creatinine, POC: 103.4 mg/dL
EGFR: 58
Microalb Creat Ratio: <3

## 2022-07-18 ENCOUNTER — Other Ambulatory Visit: Payer: Self-pay | Admitting: Internal Medicine

## 2022-07-18 DIAGNOSIS — R7989 Other specified abnormal findings of blood chemistry: Secondary | ICD-10-CM

## 2022-07-24 ENCOUNTER — Telehealth: Payer: Self-pay | Admitting: Family Medicine

## 2022-07-24 NOTE — Telephone Encounter (Signed)
Form placed on provider's desk for review and signature. 

## 2022-07-24 NOTE — Telephone Encounter (Signed)
...  Type of form received:biometric results   Additional comments:   Received FY:[id number]   Form should be Faxed OE:[phone]  Form should be mailed to:  na  Is patient requesting call for pickup: na   Form placed:   Dr Gertie Fey folder  Attach charge sheet.  Yes  Individual made aware of 3-5 business day turn around (Y/N)?  Yes   Patient emailed form to [place]- updated lab results scanned were entered on 07/20/22.

## 2022-07-25 ENCOUNTER — Ambulatory Visit (INDEPENDENT_AMBULATORY_CARE_PROVIDER_SITE_OTHER): Payer: 59 | Admitting: Family Medicine

## 2022-07-25 ENCOUNTER — Encounter: Payer: Self-pay | Admitting: Family Medicine

## 2022-07-25 VITALS — BP 124/78 | HR 85 | Temp 98.0°F | Ht 66.0 in | Wt 242.0 lb

## 2022-07-25 DIAGNOSIS — I10 Essential (primary) hypertension: Secondary | ICD-10-CM

## 2022-07-25 DIAGNOSIS — R7303 Prediabetes: Secondary | ICD-10-CM | POA: Diagnosis not present

## 2022-07-25 NOTE — Patient Instructions (Signed)
It was very nice to see you today!  Keep up the great work!   PLEASE NOTE:  If you had any lab tests please let us know if you have not heard back within a few days. You may see your results on MyChart before we have a chance to review them but we will give you a call once they are reviewed by us. If we ordered any referrals today, please let us know if you have not heard from their office within the next week.   Please try these tips to maintain a healthy lifestyle:  Eat most of your calories during the day when you are active. Eliminate processed foods including packaged sweets (pies, cakes, cookies), reduce intake of potatoes, Wente bread, [place] pasta, and Francom rice. Look for whole grain options, oat flour or almond flour.  Each meal should contain half fruits/vegetables, one quarter protein, and one quarter carbs (no bigger than a computer mouse).  Cut down on sweet beverages. This includes juice, soda, and sweet tea. Also watch fruit intake, though this is a healthier sweet option, it still contains natural sugar! Limit to 3 servings daily.  Drink at least 1 glass of water with each meal and aim for at least 8 glasses per day  Exercise at least 150 minutes every week.   

## 2022-07-25 NOTE — Progress Notes (Unsigned)
Subjective:     Patient ID: Amanda Stevens, female    DOB: 1971-05-05, 52 y.o.   MRN: [id number]  Chief Complaint  Patient presents with   Follow-up    1 month follow-up on HTN Fasting     HPI  HTN-Pt is on amlodipine 10,.  Bp's running 120-130/80-90.  No ha/dizziness/cp/palp/edema/cough/sob.  Saw neph last wk  cr 1.14.  ALT sl high PreDM-working on diet/exercise/supps/sleep  Health Maintenance Due  Topic Date Due   HIV Screening  Never done   Hepatitis C Screening  Never done   DTaP/Tdap/Td (1 - Tdap) Never done    Past Medical History:  Diagnosis Date   Blood transfusion without reported diagnosis 2013   I had a hysterectomy and lost blood   Hypertension     Past Surgical History:  Procedure Laterality Date   ABDOMINAL HYSTERECTOMY  2013   fibroids   MYOMECTOMY     PARTIAL HYSTERECTOMY      Outpatient Medications Prior to Visit  Medication Sig Dispense Refill   amLODipine (NORVASC) 10 MG tablet Take 1 tablet (10 mg total) by mouth daily. 90 tablet 1   Black Currant Seed Oil 500 MG CAPS Take by mouth.     multivitamin-iron-minerals-folic acid (CENTRUM) chewable tablet Chew 1 tablet by mouth daily.     Omega-3 1000 MG CAPS Take by mouth.     Progesterone Micronized (PROGESTERONE COMPOUNDING KIT) 20 % CREA Place onto the skin.     chlorthalidone (HYGROTON) 25 MG tablet Take 1 tablet (25 mg total) by mouth daily. 90 tablet 1   EDARBI 80 MG TABS Take 1 tablet by mouth daily.     Semaglutide,0.25 or 0.5MG /DOS, (OZEMPIC, 0.25 OR 0.5 MG/DOSE,) 2 MG/3ML SOPN INJECT 0.25 MG UNDER THE SKIN EVERY WEEK (Patient not taking: Reported on 07/25/2022)     No facility-administered medications prior to visit.    Allergies  Allergen Reactions   Egg Ewings (Egg Protein) Other (See Comments)   Flavoring Agent Other (See Comments)   ROS neg/noncontributory except as noted HPI/below      Objective:     BP 124/78   Pulse 85   Temp 98 F (36.7 C) (Temporal)   Ht 5\' 6"   (1.676 m)   Wt 242 lb (109.8 kg)   LMP  (LMP Unknown)   SpO2 98%   BMI 39.06 kg/m  Wt Readings from Last 3 Encounters:  07/25/22 242 lb (109.8 kg)  06/05/22 237 lb 12.8 oz (107.9 kg)  09/01/19 237 lb (107.5 kg)    Physical Exam   Gen: WDWN NAD HEENT: NCAT, conjunctiva not injected, sclera nonicteric NECK:  supple, no thyromegaly, no nodes, no carotid bruits CARDIAC: RRR, S1S2+, no murmur. DP 2+B LUNGS: CTAB. No wheezes ABDOMEN:  BS+, soft, NTND, No HSM, no masses EXT:  no edema MSK: no gross abnormalities.  NEURO: A&O x3.  CN II-XII intact.  PSYCH: normal mood. Good eye contact     Assessment & Plan:   Problem List Items Addressed This Visit   None   No orders of the defined types were placed in this encounter.   Angelena Sole, MD

## 2022-07-25 NOTE — Telephone Encounter (Signed)
Form completed and faxed.

## 2022-07-27 ENCOUNTER — Encounter: Payer: Self-pay | Admitting: Family Medicine

## 2022-10-23 ENCOUNTER — Other Ambulatory Visit: Payer: Self-pay | Admitting: Internal Medicine

## 2022-10-23 ENCOUNTER — Other Ambulatory Visit: Payer: Self-pay | Admitting: *Deleted

## 2022-10-23 ENCOUNTER — Telehealth: Payer: Self-pay | Admitting: Family Medicine

## 2022-10-23 DIAGNOSIS — R7989 Other specified abnormal findings of blood chemistry: Secondary | ICD-10-CM

## 2022-10-23 DIAGNOSIS — I10 Essential (primary) hypertension: Secondary | ICD-10-CM

## 2022-10-23 MED ORDER — AMLODIPINE BESYLATE 10 MG PO TABS: 10.0000 mg | ORAL_TABLET | Freq: Every day | ORAL | 1 refills | Status: DC

## 2022-10-23 NOTE — Telephone Encounter (Signed)
Rx sent to the pharmacy.

## 2022-10-23 NOTE — Telephone Encounter (Signed)
Prescription Request  10/23/2022  LOV: 07/25/2022  What is the name of the medication or equipment?  amLODipine (NORVASC) 10 MG tablet  Have you contacted your pharmacy to request a refill? Yes   Which pharmacy would you like this sent to?  [id number] - [place], [place] - [address] AT [facility] OF [address] [place] [address] Phone: [phone] Fax: [phone]    Patient notified that their request is being sent to the clinical staff for review and that they should receive a response within 2 business days.   Please advise at Mobile [phone] (mobile)

## 2022-11-03 ENCOUNTER — Ambulatory Visit
Admission: RE | Admit: 2022-11-03 | Discharge: 2022-11-03 | Disposition: A | Discharge status: Home / Self Care | Payer: 59 | Source: Ambulatory Visit | Attending: Internal Medicine | Admitting: Internal Medicine

## 2022-11-03 DIAGNOSIS — R7989 Other specified abnormal findings of blood chemistry: Secondary | ICD-10-CM

## 2022-11-17 ENCOUNTER — Ambulatory Visit: Payer: Self-pay

## 2022-11-17 NOTE — Telephone Encounter (Signed)
     Chief Complaint: Injury right foot. Larey Seat, has swelling "and a knot on the side of my foot. I can't put any weight on it." Symptoms: Pain, swelling Frequency: Yesterday Pertinent Negatives: Patient denies bruising Disposition: [] ED /[] Urgent Care (no appt availability in office) / [] Appointment(In office/virtual)/ []  [facility]/ [] Home Care/ [] Refused Recommended Disposition /[] [place] [facility]/ [x]  Follow-up with PCP Additional Notes: Will go to  if PCP office closed. Reason for Disposition  [1] SEVERE pain (e.g., excruciating, unable to do any normal activities) AND [2] not improved after 2 hours of pain medicine  Answer Assessment - Initial Assessment Questions 1. ONSET: "When did the pain start?"      Yesterday 2. LOCATION: "Where is the pain located?"      Right foot 3. PAIN: "How bad is the pain?"    (Scale 1-10; or mild, moderate, severe)  - MILD (1-3): doesn't interfere with normal activities.   - MODERATE (4-7): interferes with normal activities (e.g., work or school) or awakens from sleep, limping.   - SEVERE (8-10): excruciating pain, unable to do any normal activities, unable to walk.      Now - walking 10 4. WORK OR EXERCISE: "Has there been any recent work or exercise that involved this part of the body?"      N/A 5. CAUSE: "What do you think is causing the foot pain?"     Fall 6. OTHER SYMPTOMS: "Do you have any other symptoms?" (e.g., leg pain, rash, fever, numbness)     Swelling , knot on side of foot 7. PREGNANCY: "Is there any chance you are pregnant?" "When was your last menstrual period?"     No  Protocols used: Foot Pain-A-AH

## 2022-11-30 ENCOUNTER — Ambulatory Visit: Payer: 59 | Admitting: Family Medicine

## 2022-12-28 ENCOUNTER — Encounter (INDEPENDENT_AMBULATORY_CARE_PROVIDER_SITE_OTHER): Payer: Self-pay

## 2022-12-28 LAB — HM MAMMOGRAPHY

## 2023-01-25 ENCOUNTER — Other Ambulatory Visit: Payer: Self-pay | Admitting: Family Medicine

## 2023-01-25 ENCOUNTER — Encounter: Payer: Self-pay | Admitting: Family Medicine

## 2023-01-25 ENCOUNTER — Ambulatory Visit (INDEPENDENT_AMBULATORY_CARE_PROVIDER_SITE_OTHER): Payer: 59 | Admitting: Family Medicine

## 2023-01-25 VITALS — BP 130/88 | HR 91 | Temp 98.0°F | Resp 18 | Ht 66.0 in | Wt 239.4 lb

## 2023-01-25 DIAGNOSIS — E6609 Other obesity due to excess calories: Secondary | ICD-10-CM | POA: Diagnosis not present

## 2023-01-25 DIAGNOSIS — R7303 Prediabetes: Secondary | ICD-10-CM

## 2023-01-25 DIAGNOSIS — E66812 Obesity, class 2: Secondary | ICD-10-CM

## 2023-01-25 DIAGNOSIS — I10 Essential (primary) hypertension: Secondary | ICD-10-CM | POA: Diagnosis not present

## 2023-01-25 DIAGNOSIS — Z6838 Body mass index (BMI) 38.0-38.9, adult: Secondary | ICD-10-CM | POA: Diagnosis not present

## 2023-01-25 MED ORDER — WEGOVY 0.25 MG/0.5ML ~~LOC~~ SOAJ: 0.2500 mg | SUBCUTANEOUS | 0 refills | Status: DC

## 2023-01-25 MED ORDER — AMLODIPINE BESYLATE 5 MG PO TABS: 5.0000 mg | ORAL_TABLET | Freq: Every day | ORAL | 0 refills | Status: DC

## 2023-01-25 MED ORDER — OLMESARTAN MEDOXOMIL 20 MG PO TABS: 20.0000 mg | ORAL_TABLET | Freq: Every day | ORAL | 0 refills | Status: DC

## 2023-01-25 NOTE — Progress Notes (Signed)
Subjective:     Patient ID: Amanda Stevens, female    DOB: 12/06/1970, 52 y.o.   MRN: [id number]  Chief Complaint  Patient presents with   Medical Management of Chronic Issues    6 month follow-up on predm, htn Fasting Discuss amlodipine    HPI  HTN - Pt is on amlodipine 10 mg daily. She endorses BLE edema which she attribute to her amlodipine. She states if she doesn't take her amlodipine then her feet don't swell. She followed up with her nephrologist last week, who advised to start olmesartan.  No ha/dizziness/cp/palp/cough/sob.  PreDM / Weight management - Working on diet and exercise but has still been struggling with weight loss. Has been weight training.  She notes she eats about 1500 calories a day. She states she currently eats "very clean". We discussed her possible options of weight loss medications. Was previously on ozempic, but stopped after insurances stopped coverage.   Menopause - She states it has always been easy for her to exercise and stay active until she started menopause, then it became harder for her to manage her weight. Currently on topical progesterone. She is established with gyn.    She reports she had recent labs done at her visit with the nephrologist. Pt presented latest lab work from 8/26 on her phone:  Urine protein: neg  Glucose: neg Few WBCs Protein: 7.5  Alb: 4.2  Alb/Creat: 3 Vit D: 59 GLU: 102 BUN: 14  Creat: 1.26 GFR: 51 Na: 141 K: 4.5  Cl: 106 CO2: 23 Ca: 10 Mg: 2.3 Hgb: 14.5  Health Maintenance Due  Topic Date Due   HIV Screening  Never done   Hepatitis C Screening  Never done   DTaP/Tdap/Td (1 - Tdap) Never done    Past Medical History:  Diagnosis Date   Blood transfusion without reported diagnosis 2013   I had a hysterectomy and lost blood   Hypertension     Past Surgical History:  Procedure Laterality Date   ABDOMINAL HYSTERECTOMY  2013   fibroids   MYOMECTOMY     PARTIAL HYSTERECTOMY       Current  Outpatient Medications:    amLODipine (NORVASC) 5 MG tablet, Take 1 tablet (5 mg total) by mouth daily., Disp: 30 tablet, Rfl: 0   Black Currant Seed Oil 500 MG CAPS, Take by mouth., Disp: , Rfl:    multivitamin-iron-minerals-folic acid (CENTRUM) chewable tablet, Chew 1 tablet by mouth daily., Disp: , Rfl:    olmesartan (BENICAR) 20 MG tablet, Take 1 tablet (20 mg total) by mouth daily., Disp: 30 tablet, Rfl: 0   Omega-3 1000 MG CAPS, Take by mouth., Disp: , Rfl:    Progesterone Micronized (PROGESTERONE COMPOUNDING KIT) 20 % CREA, Place onto the skin., Disp: , Rfl:    Semaglutide-Weight Management (WEGOVY) 0.25 MG/0.5ML SOAJ, Inject 0.25 mg into the skin once a week., Disp: 2 mL, Rfl: 0  Allergies  Allergen Reactions   Egg Huneke (Egg Protein) Other (See Comments)   Flavoring Agent Other (See Comments)   ROS neg/noncontributory except as noted HPI/below      Objective:     BP 130/88   Pulse 91   Temp 98 F (36.7 C) (Temporal)   Resp 18   Ht 5\' 6"  (1.676 m)   Wt 239 lb 6 oz (108.6 kg)   LMP  (LMP Unknown)   SpO2 96%   BMI 38.64 kg/m  Wt Readings from Last 3 Encounters:  01/25/23 239 lb 6 oz (  108.6 kg)  07/25/22 242 lb (109.8 kg)  06/05/22 237 lb 12.8 oz (107.9 kg)    Physical Exam   Gen: WDWN NAD HEENT: NCAT, conjunctiva not injected, sclera nonicteric NECK:  supple, no thyromegaly, no nodes, no carotid bruits CARDIAC: RRR, S1S2+, no murmur. DP 2+B LUNGS: CTAB. No wheezes ABDOMEN:  BS+, soft, NTND, No HSM, no masses EXT:  no edema MSK: no gross abnormalities.  NEURO: A&O x3.  CN II-XII intact.  PSYCH: normal mood. Good eye contact  Reviewed latest labs from 8/26 on Pt's phone.  Educated Pt on options of weight loss medications.     Assessment & Plan:  Essential hypertension Assessment & Plan: Chronic.  Controlled but SE to amlodipine 10mg .  Also, neph rec ARB for renal protection.  Will reduce amlodipine to 5mg  and start olmesartan 20mg  daily.  Goal is combo  pill of olmesartan/amlodipine but need to get final dosing.    Orders: -     Olmesartan Medoxomil; Take 1 tablet (20 mg total) by mouth daily.  Dispense: 30 tablet; Refill: 0 -     amLODIPine Besylate; Take 1 tablet (5 mg total) by mouth daily.  Dispense: 30 tablet; Refill: 0  Prediabetes Assessment & Plan: Chronic.  Pt working hard on diet/exercise.  Continue.    Orders: -     Comprehensive metabolic panel; Future -     Hemoglobin A1c; Future -     Lipid panel; Future  Class 2 obesity due to excess calories with body mass index (BMI) of 38.0 to 38.9 in adult, unspecified whether serious comorbidity present -     ONGEXB; Inject 0.25 mg into the skin once a week.  Dispense: 2 mL; Refill: 0  3.  Obesity-has good diet and exercise routine.  Suspect menopause causing some of the issues.  Will trial wegovy 0.25mg  weekly-SED and constipation prevention.  Advised insurance may not cover wt loss meds.  Understand frustration.    Return in about 3 weeks (around 02/15/2023) for HTN.  I,Rachel Rivera,acting as a scribe for Angelena Sole, MD.,have documented all relevant documentation on the behalf of Angelena Sole, MD,as directed by  Angelena Sole, MD while in the presence of Angelena Sole, MD.  I, Angelena Sole, MD, have reviewed all documentation for this visit. The documentation on 01/25/23 for the exam, diagnosis, procedures, and orders are all accurate and complete.    Angelena Sole, MD

## 2023-01-25 NOTE — Patient Instructions (Signed)
It was very nice to see you today!  Don't get constipated.  Take colace 100mg  and miralax   PLEASE NOTE:  If you had any lab tests please let us know if you have not heard back within a few days. You may see your results on MyChart before we have a chance to review them but we will give you a call once they are reviewed by [place]. If we ordered any referrals today, please let us know if you have not heard from their office within the next week.   Please try these tips to maintain a healthy lifestyle:  Eat most of your calories during the day when you are active. Eliminate processed foods including packaged sweets (pies, cakes, cookies), reduce intake of potatoes, Thew bread, [place] pasta, and Cercone rice. Look for whole grain options, oat flour or almond flour.  Each meal should contain half fruits/vegetables, one quarter protein, and one quarter carbs (no bigger than a computer mouse).  Cut down on sweet beverages. This includes juice, soda, and sweet tea. Also watch fruit intake, though this is a healthier sweet option, it still contains natural sugar! Limit to 3 servings daily.  Drink at least 1 glass of water with each meal and aim for at least 8 glasses per day  Exercise at least 150 minutes every week.

## 2023-01-25 NOTE — Assessment & Plan Note (Signed)
Chronic.  Controlled but SE to amlodipine 10mg .  Also, neph rec ARB for renal protection.  Will reduce amlodipine to 5mg  and start olmesartan 20mg  daily.  Goal is combo pill of olmesartan/amlodipine but need to get final dosing.

## 2023-01-25 NOTE — Assessment & Plan Note (Signed)
Chronic.  Pt working hard on diet/exercise.  Continue.

## 2023-01-26 ENCOUNTER — Telehealth: Payer: Self-pay | Admitting: Family Medicine

## 2023-01-26 ENCOUNTER — Encounter: Payer: Self-pay | Admitting: *Deleted

## 2023-01-26 NOTE — Telephone Encounter (Signed)
Patient notified of message below.

## 2023-01-26 NOTE — Telephone Encounter (Signed)
Patient states she called in regards to the [facility]. States she was informed by her insurance that in order for medication to be approved, the diagnosis/need for medication has to be cardiovascular risk. States that they informed her if weight loss is mentioned then it will be denied.

## 2023-01-29 NOTE — Telephone Encounter (Signed)
Patient called for an update. I read PCP message below and patient expressed her confusion. States that she looked it up and that due to her having high cholesterol and high blood pressure she would be considered to be at cardiovascular risk. Patient went on to express frustrations at how her quality of care hasn't been good overall. She is requesting someone clinical call her by the end of the week at the very least.

## 2023-01-30 ENCOUNTER — Other Ambulatory Visit: Payer: Self-pay | Admitting: *Deleted

## 2023-01-30 DIAGNOSIS — E6609 Other obesity due to excess calories: Secondary | ICD-10-CM

## 2023-01-30 DIAGNOSIS — R7303 Prediabetes: Secondary | ICD-10-CM

## 2023-01-30 DIAGNOSIS — I10 Essential (primary) hypertension: Secondary | ICD-10-CM

## 2023-01-30 NOTE — Telephone Encounter (Signed)
I have spoken with patient in regard.  She is interested in extensive care for weight management.    Can a referral to PCW[facility] at [facility] be entered?  Thanks!

## 2023-01-30 NOTE — Telephone Encounter (Signed)
Referral placed as requested.

## 2023-01-31 NOTE — Telephone Encounter (Signed)
Patient is scheduled with Dr. Cathey Endow for 9/19.  I have followed up with patient in regard and have asked for her to let me know if there is anything else we can assist her with.

## 2023-02-01 ENCOUNTER — Ambulatory Visit (INDEPENDENT_AMBULATORY_CARE_PROVIDER_SITE_OTHER): Payer: 59 | Admitting: Family Medicine

## 2023-02-01 ENCOUNTER — Encounter: Payer: Self-pay | Admitting: Family Medicine

## 2023-02-01 VITALS — BP 120/76 | HR 93 | Temp 98.0°F | Ht 66.0 in | Wt 235.0 lb

## 2023-02-01 DIAGNOSIS — E669 Obesity, unspecified: Secondary | ICD-10-CM | POA: Diagnosis not present

## 2023-02-01 DIAGNOSIS — I10 Essential (primary) hypertension: Secondary | ICD-10-CM | POA: Diagnosis not present

## 2023-02-01 DIAGNOSIS — R7303 Prediabetes: Secondary | ICD-10-CM | POA: Diagnosis not present

## 2023-02-01 DIAGNOSIS — Z6837 Body mass index (BMI) 37.0-37.9, adult: Secondary | ICD-10-CM

## 2023-02-01 NOTE — Assessment & Plan Note (Signed)
Lab Results  Component Value Date   HGBA1C 5.8 05/04/2022   She has been diagnosed with prediabetes She worries about developing diabetes She denies a high intake of starches or sweets and gets in a good cardio workout 3+ days/ wk  Check A1c and fasting insulin next visit  Consider use of metformin

## 2023-02-01 NOTE — Assessment & Plan Note (Signed)
BP is well controlled on Olmesartan 20 mg daily and amlodopine 5 mg daily without HA or CP  Look for BP improvements with weight reduction Avoid use of AOMs that may increase BP

## 2023-02-01 NOTE — Progress Notes (Signed)
Office: [phone]  /  Fax: [phone]   Initial Visit  Amanda Stevens was seen in clinic today to evaluate for obesity. She is interested in losing weight to improve overall health and reduce the risk of weight related complications. She presents today to review program treatment options, initial physical assessment, and evaluation.     She was referred by: PCP  When asked what else they would like to accomplish? She states: Improve existing medical conditions, Reduce number of medications, Improve quality of life, and Improve appearance  Weight history: gained weight after partial hysterectomy  in 2013; weighed 170 lb prior to that.  Ovaries are in but she felt like she went thru menopausal.  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 She states: Contributed to medical problems and Contributed to orthopedic problems or mobility issues  Some associated conditions: Hypertension and Prediabetes  Contributing factors: Family history and Menopause  Weight promoting medications identified: None  Current nutrition plan: None  Current level of physical activity: Walking and Other: Zumba  Current or previous pharmacotherapy: GLP-1  Response to medication: Ineffective so it was discontinued   Past medical history includes:   Past Medical History:  Diagnosis Date   Blood transfusion without reported diagnosis 2013   I had a hysterectomy and lost blood   Hypertension      Objective:   BP 120/76   Pulse 93   Temp 98 F (36.7 C)   Ht 5\' 6"  (1.676 m)   Wt 235 lb (106.6 kg)   LMP  (LMP Unknown)   SpO2 98%   BMI 37.93 kg/m  She was weighed on the bioimpedance scale: Body mass index is 37.93 kg/m.  Peak Weight:240 , Body Fat%:44.4, Visceral Fat Rating:13, Weight trend over the last 12 months: Increasing  General:  Alert, oriented and cooperative. Patient is in no acute distress.  Respiratory: Normal respiratory effort, no problems with  respiration noted   Gait: able to ambulate independently  Mental Status: Normal mood and affect. Normal behavior. Normal judgment and thought content.   DIAGNOSTIC DATA REVIEWED:  BMET    Component Value Date/Time   NA 137 07/12/2022 0000   K 4.4 07/12/2022 0000   CL 104 07/12/2022 0000   CO2 23 (A) 07/12/2022 0000   BUN 15 07/12/2022 0000   CREATININE 1.1 07/12/2022 0000   CALCIUM 9.6 07/12/2022 0000   Lab Results  Component Value Date   HGBA1C 5.8 05/04/2022   No results found for: "INSULIN" CBC    Component Value Date/Time   WBC 5.6 07/12/2022 0000   RBC 5.20 (A) 07/12/2022 0000   HGB 13.7 07/12/2022 0000   HCT 41 07/12/2022 0000   PLT 288 07/12/2022 0000   Iron/TIBC/Ferritin/ %Sat No results found for: "IRON", "TIBC", "FERRITIN", "IRONPCTSAT" Lipid Panel     Component Value Date/Time   CHOL 222 (A) 05/04/2022 0000   TRIG 149 05/04/2022 0000   HDL 65 05/04/2022 0000   LDLCALC 131 05/04/2022 0000   Hepatic Function Panel     Component Value Date/Time   ALBUMIN 4.5 07/12/2022 0000   AST 30 07/12/2022 0000   ALT 39 (A) 07/12/2022 0000   ALKPHOS 69 07/12/2022 0000      Component Value Date/Time   TSH 1.75 05/04/2022 0000     Assessment and Plan:   Prediabetes Assessment & Plan: Lab Results  Component Value Date   HGBA1C 5.8 05/04/2022   She has been diagnosed  with prediabetes She worries about developing diabetes She denies a high intake of starches or sweets and gets in a good cardio workout 3+ days/ wk  Check A1c and fasting insulin next visit  Consider use of metformin   Obesity, Class II, BMI 35-39.9  BMI 37.0-37.9, adult  Essential hypertension Assessment & Plan: BP is well controlled on Olmesartan 20 mg daily and amlodopine 5 mg daily without HA or CP  Look for BP improvements with weight reduction Avoid use of AOMs that may increase BP         Obesity Treatment / Action Plan:  Patient will work on garnering support from  family and friends to begin weight loss journey. Will work on eliminating or reducing the presence of highly palatable, calorie dense foods in the home. Will complete provided nutritional and psychosocial assessment questionnaire before the next appointment. Will be scheduled for indirect calorimetry to determine resting energy expenditure in a fasting state.  This will allow [place] to create a reduced calorie, high-protein meal plan to promote loss of fat mass while preserving muscle mass. Will work on reading labels, making healthier choices and watching portion sizes. Counseled on the health benefits of losing 5%-15% of total body weight. Was counseled on nutritional approaches to weight loss and benefits of reducing processed foods and consuming plant-based foods and high quality protein as part of nutritional weight management. Was counseled on pharmacotherapy and role as an adjunct in weight management.   Obesity Education Performed Today:  She was weighed on the bioimpedance scale and results were discussed and documented in the synopsis.  We discussed obesity as a disease and the importance of a more detailed evaluation of all the factors contributing to the disease.  We discussed the importance of long term lifestyle changes which include nutrition, exercise and behavioral modifications as well as the importance of customizing this to her specific health and social needs.  We discussed the benefits of reaching a healthier weight to alleviate the symptoms of existing conditions and reduce the risks of the biomechanical, metabolic and psychological effects of obesity.  Ahley Dorame appears to be in the action stage of change and states they are ready to start intensive lifestyle modifications and behavioral modifications.  30 minutes was spent today on this visit including the above counseling, pre-visit chart review, and post-visit documentation.  Reviewed by clinician on day of visit:  allergies, medications, problem list, medical history, surgical history, family history, social history, and previous encounter notes pertinent to obesity diagnosis.    Seymour Bars, D.O. DABFM, DABOM [facility] [phone] [place], [place] [address] [phone]

## 2023-02-08 DIAGNOSIS — Z0289 Encounter for other administrative examinations: Secondary | ICD-10-CM

## 2023-02-13 ENCOUNTER — Other Ambulatory Visit: Payer: Self-pay | Admitting: Family Medicine

## 2023-02-13 DIAGNOSIS — E66812 Obesity, class 2: Secondary | ICD-10-CM

## 2023-02-14 ENCOUNTER — Telehealth: Payer: Self-pay

## 2023-02-14 ENCOUNTER — Other Ambulatory Visit (HOSPITAL_COMMUNITY): Payer: Self-pay

## 2023-02-14 ENCOUNTER — Other Ambulatory Visit: Payer: Self-pay | Admitting: Family Medicine

## 2023-02-14 DIAGNOSIS — I10 Essential (primary) hypertension: Secondary | ICD-10-CM

## 2023-02-14 MED ORDER — WEGOVY 0.25 MG/0.5ML ~~LOC~~ SOAJ: 0.2500 mg | SUBCUTANEOUS | 0 refills | Status: DC

## 2023-02-14 NOTE — Telephone Encounter (Signed)
Pharmacy Patient Advocate Encounter   Received notification from RX Request Messages that prior authorization for Wegovy 0.25mg /0.59ml is required/requested.   Insurance verification completed.   The patient is insured through [facility] .   Per test claim: PA required; PA submitted to [facility] via CoverMyMeds Key/confirmation #/EOC [id number] Status is pending

## 2023-02-14 NOTE — Telephone Encounter (Signed)
Spoke to patient and informed her that her PA has been completed and that we are waiting on the decision from her insurance company and will let her know when we receive the decision. Patient verbalized understanding.

## 2023-02-14 NOTE — Telephone Encounter (Signed)
Please call pt back in regards with the bottom message. Please call pt back.

## 2023-02-15 ENCOUNTER — Other Ambulatory Visit (HOSPITAL_COMMUNITY): Payer: Self-pay

## 2023-02-15 MED ORDER — AMLODIPINE BESYLATE 5 MG PO TABS: 5.0000 mg | ORAL_TABLET | Freq: Every day | ORAL | 0 refills | Status: DC

## 2023-02-15 NOTE — Telephone Encounter (Signed)
Pt called back and states the diagnosis for meds was put in incorrectly, it was supposed to be for weight loss but it was put in as cardio vascular. Please advise.

## 2023-02-15 NOTE — Telephone Encounter (Signed)
Spoke with patient and she stated that she received a call stating that PA was denied. Dx need to be for weight loss and not Cardiovascular.

## 2023-02-15 NOTE — Telephone Encounter (Signed)
Pharmacy Patient Advocate Encounter   Received notification from Pt Calls Messages that prior authorization for [facility]  is required/requested.   Insurance verification completed.   The patient is insured through [facility] .   Per test claim: PA required; PA submitted to [facility] via CoverMyMeds Key/confirmation #/EOC [id number] Status is pending   *Resubmitted prior authorization.

## 2023-02-16 ENCOUNTER — Encounter: Payer: Self-pay | Admitting: *Deleted

## 2023-02-16 NOTE — Telephone Encounter (Signed)
Patient notified of message below.

## 2023-02-17 ENCOUNTER — Other Ambulatory Visit: Payer: Self-pay | Admitting: Family Medicine

## 2023-02-17 DIAGNOSIS — I10 Essential (primary) hypertension: Secondary | ICD-10-CM

## 2023-02-19 NOTE — Telephone Encounter (Signed)
Noted  

## 2023-02-19 NOTE — Telephone Encounter (Signed)
Pharmacy Patient Advocate Encounter  Received notification from [facility] that Prior Authorization for [facility] 0.25mg /0.37ml has been DENIED.  See denial reason below. No denial letter attached in CMM. Will attache denial letter to Media tab once received.   PA #/Case ID/Reference #: [id number]

## 2023-02-22 ENCOUNTER — Ambulatory Visit (HOSPITAL_COMMUNITY)
Admission: RE | Admit: 2023-02-22 | Discharge: 2023-02-22 | Disposition: A | Discharge status: Home / Self Care | Payer: 59 | Source: Ambulatory Visit | Attending: Family Medicine | Admitting: Family Medicine

## 2023-02-22 ENCOUNTER — Encounter: Payer: Self-pay | Admitting: Family Medicine

## 2023-02-22 ENCOUNTER — Other Ambulatory Visit: Payer: Self-pay

## 2023-02-22 ENCOUNTER — Ambulatory Visit (INDEPENDENT_AMBULATORY_CARE_PROVIDER_SITE_OTHER): Payer: 59 | Admitting: Family Medicine

## 2023-02-22 VITALS — BP 138/84 | HR 75 | Temp 98.1°F | Ht 66.0 in | Wt 236.0 lb

## 2023-02-22 DIAGNOSIS — F3289 Other specified depressive episodes: Secondary | ICD-10-CM | POA: Diagnosis not present

## 2023-02-22 DIAGNOSIS — I10 Essential (primary) hypertension: Secondary | ICD-10-CM

## 2023-02-22 DIAGNOSIS — F32A Depression, unspecified: Secondary | ICD-10-CM | POA: Insufficient documentation

## 2023-02-22 DIAGNOSIS — Z6838 Body mass index (BMI) 38.0-38.9, adult: Secondary | ICD-10-CM

## 2023-02-22 DIAGNOSIS — R632 Polyphagia: Secondary | ICD-10-CM | POA: Diagnosis not present

## 2023-02-22 DIAGNOSIS — Z1331 Encounter for screening for depression: Secondary | ICD-10-CM | POA: Diagnosis not present

## 2023-02-22 DIAGNOSIS — E669 Obesity, unspecified: Secondary | ICD-10-CM

## 2023-02-22 DIAGNOSIS — R0683 Snoring: Secondary | ICD-10-CM

## 2023-02-22 DIAGNOSIS — R5383 Other fatigue: Secondary | ICD-10-CM

## 2023-02-22 DIAGNOSIS — R0602 Shortness of breath: Secondary | ICD-10-CM

## 2023-02-22 DIAGNOSIS — E66812 Obesity, class 2: Secondary | ICD-10-CM

## 2023-02-22 HISTORY — DX: Depression, unspecified: F32.A

## 2023-02-22 MED ORDER — TIRZEPATIDE-WEIGHT MANAGEMENT 2.5 MG/0.5ML ~~LOC~~ SOLN
2.5000 mg | SUBCUTANEOUS | 0 refills | Status: DC
Start: 2023-02-22 — End: 2023-03-13

## 2023-02-23 ENCOUNTER — Ambulatory Visit: Payer: 59 | Admitting: Family Medicine

## 2023-02-24 LAB — HEMOGLOBIN A1C
Est. average glucose Bld gHb Est-mCnc: 128 mg/dL
Hgb A1c MFr Bld: 6.1 % — ABNORMAL HIGH (ref 4.8–5.6)

## 2023-02-24 LAB — CBC WITH DIFFERENTIAL/PLATELET
Basophils Absolute: 0 10*3/uL (ref 0.0–0.2)
Basos: 1 %
EOS (ABSOLUTE): 0.1 10*3/uL (ref 0.0–0.4)
Eos: 2 %
Hematocrit: 43.8 % (ref 34.0–46.6)
Hemoglobin: 14 g/dL (ref 11.1–15.9)
Immature Grans (Abs): 0 10*3/uL (ref 0.0–0.1)
Immature Granulocytes: 0 %
Lymphocytes Absolute: 1.7 10*3/uL (ref 0.7–3.1)
Lymphs: 30 %
MCH: 25.9 pg — ABNORMAL LOW (ref 26.6–33.0)
MCHC: 32 g/dL (ref 31.5–35.7)
MCV: 81 fL (ref 79–97)
Monocytes Absolute: 0.3 10*3/uL (ref 0.1–0.9)
Monocytes: 5 %
Neutrophils Absolute: 3.6 10*3/uL (ref 1.4–7.0)
Neutrophils: 62 %
Platelets: 333 10*3/uL (ref 150–450)
RBC: 5.4 x10E6/uL — ABNORMAL HIGH (ref 3.77–5.28)
RDW: 13.7 % (ref 11.7–15.4)
WBC: 5.8 10*3/uL (ref 3.4–10.8)

## 2023-02-24 LAB — COMPREHENSIVE METABOLIC PANEL
ALT: 33 [IU]/L — ABNORMAL HIGH (ref 0–32)
AST: 29 [IU]/L (ref 0–40)
Albumin: 4.3 g/dL (ref 3.8–4.9)
Alkaline Phosphatase: 74 [IU]/L (ref 44–121)
BUN/Creatinine Ratio: 11 (ref 9–23)
BUN: 12 mg/dL (ref 6–24)
Bilirubin Total: 0.3 mg/dL (ref 0.0–1.2)
CO2: 21 mmol/L (ref 20–29)
Calcium: 9.7 mg/dL (ref 8.7–10.2)
Chloride: 104 mmol/L (ref 96–106)
Creatinine, Ser: 1.14 mg/dL — ABNORMAL HIGH (ref 0.57–1.00)
Globulin, Total: 2.6 g/dL (ref 1.5–4.5)
Glucose: 100 mg/dL — ABNORMAL HIGH (ref 70–99)
Potassium: 4.5 mmol/L (ref 3.5–5.2)
Sodium: 141 mmol/L (ref 134–144)
Total Protein: 6.9 g/dL (ref 6.0–8.5)
eGFR: 57 mL/min/{1.73_m2} — ABNORMAL LOW (ref >59)

## 2023-02-24 LAB — LIPID PANEL
Chol/HDL Ratio: 5.6 {ratio} — ABNORMAL HIGH (ref 0.0–4.4)
Cholesterol, Total: 324 mg/dL — ABNORMAL HIGH (ref 100–199)
HDL: 58 mg/dL (ref >39)
LDL Chol Calc (NIH): 223 mg/dL — ABNORMAL HIGH (ref 0–99)
Triglycerides: 216 mg/dL — ABNORMAL HIGH (ref 0–149)
VLDL Cholesterol Cal: 43 mg/dL — ABNORMAL HIGH (ref 5–40)

## 2023-02-24 LAB — INSULIN, RANDOM: INSULIN: 19.6 u[IU]/mL (ref 2.6–24.9)

## 2023-02-24 LAB — VITAMIN B12: Vitamin B-12: 832 pg/mL (ref 232–1245)

## 2023-02-24 LAB — VITAMIN D 25 HYDROXY (VIT D DEFICIENCY, FRACTURES): Vit D, 25-Hydroxy: 56.5 ng/mL (ref 30.0–100.0)

## 2023-02-24 LAB — T4, FREE: Free T4: 1.37 ng/dL (ref 0.82–1.77)

## 2023-02-24 LAB — FOLATE: Folate: 13.2 ng/mL (ref >3.0)

## 2023-02-24 LAB — TSH: TSH: 1.57 u[IU]/mL (ref 0.450–4.500)

## 2023-02-26 ENCOUNTER — Telehealth: Payer: Self-pay | Admitting: *Deleted

## 2023-02-26 NOTE — Progress Notes (Signed)
Chief Complaint:   OBESITY Amanda Stevens (MR# [id number]) is a 52 y.o. female who presents for evaluation and treatment of obesity and related comorbidities. Current BMI is Body mass index is 38.09 kg/m. Amanda Stevens has been struggling with her weight for many years and has been unsuccessful in either losing weight, maintaining weight loss, or reaching her healthy weight goal.  [name] is currently in the action stage of change and ready to dedicate time achieving and maintaining a healthier weight. Amanda Stevens is interested in becoming our patient and working on intensive lifestyle modifications including (but not limited to) diet and exercise for weight loss.  Patient works as an Manufacturing systems engineer.  She lives alone.  She works out 1 hour 3 times per week.  She would like to lose 60 pounds.  She is status post TAH, and she eats a vegetarian diet.  Amanda Stevens's habits were reviewed today and are as follows: her desired weight loss is 71 lbs, she started gaining excessive weight in 2013, after hysterectomy, her heaviest weight ever was 240 pounds, she has significant food cravings issues, she snacks frequently in the evenings, she skips meals frequently, she is trying to follow a vegetarian diet, and she struggles with emotional eating.  Depression Screen Amanda Stevens's Food and Mood (modified PHQ-9) score was 7.  Subjective:   1. Other fatigue Amanda Stevens admits to daytime somnolence and admits to waking up still tired. Patient has a history of symptoms of daytime fatigue and morning fatigue. Amanda Stevens generally gets 5 or 7 hours of sleep per night, and states that she has nightime awakenings. Snoring is present. Apneic episodes are not present. Epworth Sleepiness Score is 12.  EKG: Normal sinus rhythm at 78 bpm without ischemia.  2. SOBOE (shortness of breath on exertion) Amanda Stevens notes increasing shortness of breath with exercising and seems to be worsening over time with weight gain. She notes getting out  of breath sooner with activity than she used to. This has not gotten worse recently. Amanda Stevens denies shortness of breath at rest or orthopnea.  3. Essential hypertension Patient's blood pressure is controlled on amlodipine 10 mg once daily with some leg edema.  She was prescribed olmesartan 20 mg, but she is not taking it.  4. Snoring Patient averages 5 to 7 hours of sleep.  Her Epworth score is 12.  5. Polyphagia Patient took Ozempic 0.25 mg weekly in the past.  She took it for 2 to 3 months without GI side effects.  She did not have coverage to stay on it.  6. Other depression with emotional eating Patient's bariatric PHQ-9 score is 7.  She denies high stress levels.  She tends to like comfort foods.  Assessment/Plan:   1. Other fatigue Amanda Stevens does feel that her weight is causing her energy to be lower than it should be. Fatigue may be related to obesity, depression or many other causes. Labs will be ordered, and in the meanwhile, Amanda Stevens will focus on self care including making healthy food choices, increasing physical activity and focusing on stress reduction.  - EKG 12-Lead - VITAMIN D 25 Hydroxy (Vit-D Deficiency, Fractures) - TSH - T4, free - Lipid panel - Insulin, random - Hemoglobin A1c - Folate - Comprehensive metabolic panel - Vitamin B12 - CBC with Differential/Platelet  2. SOBOE (shortness of breath on exertion) Amanda Stevens does feel that she gets out of breath more easily that she used to when she exercises. Amanda Stevens's shortness of breath appears to be obesity related and  exercise induced. She has agreed to work on weight loss and gradually increase exercise to treat her exercise induced shortness of breath. Will continue to monitor closely.  3. Essential hypertension Look for blood pressure improvements with weight loss.  4. Snoring Aim for 7 to 8 hours of sleep, and consider sleep study.  5. Polyphagia Patient agreed to begin Zepbound at 2.5 mg once weekly with no  refills. Patient denies a personal or family history of pancreatitis, medullary thyroid carcinoma or multiple endocrine neoplasia type II. Recommend reviewing pen training video online.   - tirzepatide (ZEPBOUND) 2.5 MG/0.5ML injection vial; Inject 2.5 mg into the skin once a week.  Dispense: 2 mL; Refill: 0  6. Other depression with emotional eating Patient is to work on stress reduction, and keeping junk food out of the house.  7. Depression screen Amanda Stevens had a positive depression screening. Depression is commonly associated with obesity and often results in emotional eating behaviors. We will monitor this closely and work on CBT to help improve the non-hunger eating patterns. Referral to Psychology may be required if no improvement is seen as she continues in our clinic.  8. Obesity, Class II, BMI 35-39.9 Start Zepbound.   - tirzepatide (ZEPBOUND) 2.5 MG/0.5ML injection vial; Inject 2.5 mg into the skin once a week.  Dispense: 2 mL; Refill: 0  9. Generalized obesity with starting BMI 38.1 Amanda Stevens is currently in the action stage of change and her goal is to continue with weight loss efforts. I recommend Amanda Stevens begin the structured treatment plan as follows:  She has agreed to the Category 3 Plan.  100-calorie snack list was given. Skip fasting.   Exercise goals: As is.    Behavioral modification strategies: increasing lean protein intake, increasing vegetables, increasing water intake, no skipping meals, keeping healthy foods in the home, better snacking choices, and planning for success.  She was informed of the importance of frequent follow-up visits to maximize her success with intensive lifestyle modifications for her multiple health conditions. She was informed we would discuss her lab results at her next visit unless there is a critical issue that needs to be addressed sooner. Amanda Stevens agreed to keep her next visit at the agreed upon time to discuss these results.  Objective:    Blood pressure 138/84, pulse 75, temperature 98.1 F (36.7 C), height 5\' 6"  (1.676 m), weight 236 lb (107 kg), SpO2 99%. Body mass index is 38.09 kg/m.  EKG: Normal sinus rhythm, rate 78 BPM.  Indirect Calorimeter completed today shows a VO2 of 290 and a REE of 2002.  Her calculated basal metabolic rate is 2130 thus her basal metabolic rate is better than expected.  General: Cooperative, alert, well developed, in no acute distress. HEENT: Conjunctivae and lids unremarkable. Cardiovascular: Regular rhythm.  Lungs: Normal work of breathing. Neurologic: No focal deficits.   Lab Results  Component Value Date   CREATININE 1.14 (H) 02/22/2023   BUN 12 02/22/2023   NA 141 02/22/2023   K 4.5 02/22/2023   CL 104 02/22/2023   CO2 21 02/22/2023   Lab Results  Component Value Date   ALT 33 (H) 02/22/2023   AST 29 02/22/2023   ALKPHOS 74 02/22/2023   BILITOT 0.3 02/22/2023   Lab Results  Component Value Date   HGBA1C 6.1 (H) 02/22/2023   HGBA1C 5.8 05/04/2022   Lab Results  Component Value Date   INSULIN 19.6 02/22/2023   Lab Results  Component Value Date   TSH 1.570 02/22/2023  Lab Results  Component Value Date   CHOL 324 (H) 02/22/2023   HDL 58 02/22/2023   LDLCALC 223 (H) 02/22/2023   TRIG 216 (H) 02/22/2023   CHOLHDL 5.6 (H) 02/22/2023   Lab Results  Component Value Date   WBC 5.8 02/22/2023   HGB 14.0 02/22/2023   HCT 43.8 02/22/2023   MCV 81 02/22/2023   PLT 333 02/22/2023   No results found for: "IRON", "TIBC", "FERRITIN"  Attestation Statements:   Reviewed by clinician on day of visit: allergies, medications, problem list, medical history, surgical history, family history, social history, and previous encounter notes.  Time spent on visit including pre-visit chart review and post-visit charting and care was 40 minutes.   Trude Mcburney, am acting as transcriptionist for Seymour Bars, DO.  I have reviewed the above documentation for accuracy and  completeness, and I agree with the above. - Bron Snellings, DO

## 2023-02-26 NOTE — Telephone Encounter (Signed)
Prior authorization done via cover my meds for patients Zepbound. Waiting on determination.

## 2023-02-26 NOTE — Telephone Encounter (Signed)
Prior authorization denied for patients Zepbound. Her insurance does not cover any weight loss mediations. Patient notified.

## 2023-02-27 ENCOUNTER — Telehealth: Payer: Self-pay | Admitting: Family Medicine

## 2023-02-27 ENCOUNTER — Other Ambulatory Visit: Payer: Self-pay | Admitting: Family Medicine

## 2023-02-27 DIAGNOSIS — I10 Essential (primary) hypertension: Secondary | ICD-10-CM

## 2023-02-27 NOTE — Telephone Encounter (Signed)
Left message to return my call.  

## 2023-02-27 NOTE — Telephone Encounter (Signed)
Patient returned call. Requests to be called.

## 2023-02-28 ENCOUNTER — Other Ambulatory Visit: Payer: Self-pay | Admitting: Family Medicine

## 2023-02-28 DIAGNOSIS — I10 Essential (primary) hypertension: Secondary | ICD-10-CM

## 2023-03-05 ENCOUNTER — Ambulatory Visit: Payer: 59 | Admitting: Bariatrics

## 2023-03-07 ENCOUNTER — Ambulatory Visit: Payer: 59 | Admitting: Family Medicine

## 2023-03-13 ENCOUNTER — Ambulatory Visit: Payer: 59 | Admitting: Family Medicine

## 2023-03-13 ENCOUNTER — Ambulatory Visit (INDEPENDENT_AMBULATORY_CARE_PROVIDER_SITE_OTHER): Payer: 59 | Admitting: Family Medicine

## 2023-03-13 ENCOUNTER — Encounter: Payer: Self-pay | Admitting: Family Medicine

## 2023-03-13 ENCOUNTER — Other Ambulatory Visit: Payer: Self-pay | Admitting: Family Medicine

## 2023-03-13 VITALS — BP 138/91 | HR 88 | Temp 98.2°F | Ht 66.0 in | Wt 232.0 lb

## 2023-03-13 DIAGNOSIS — R7401 Elevation of levels of liver transaminase levels: Secondary | ICD-10-CM | POA: Insufficient documentation

## 2023-03-13 DIAGNOSIS — I129 Hypertensive chronic kidney disease with stage 1 through stage 4 chronic kidney disease, or unspecified chronic kidney disease: Secondary | ICD-10-CM | POA: Diagnosis not present

## 2023-03-13 DIAGNOSIS — E66812 Obesity, class 2: Secondary | ICD-10-CM

## 2023-03-13 DIAGNOSIS — E782 Mixed hyperlipidemia: Secondary | ICD-10-CM | POA: Insufficient documentation

## 2023-03-13 DIAGNOSIS — Z6837 Body mass index (BMI) 37.0-37.9, adult: Secondary | ICD-10-CM

## 2023-03-13 DIAGNOSIS — E88819 Insulin resistance, unspecified: Secondary | ICD-10-CM | POA: Insufficient documentation

## 2023-03-13 DIAGNOSIS — R7303 Prediabetes: Secondary | ICD-10-CM

## 2023-03-13 DIAGNOSIS — N1831 Chronic kidney disease, stage 3a: Secondary | ICD-10-CM | POA: Insufficient documentation

## 2023-03-13 DIAGNOSIS — I10 Essential (primary) hypertension: Secondary | ICD-10-CM

## 2023-03-13 HISTORY — DX: Mixed hyperlipidemia: E78.2

## 2023-03-13 MED ORDER — METFORMIN HCL 500 MG PO TABS
500.0000 mg | ORAL_TABLET | Freq: Every day | ORAL | 0 refills | Status: DC
Start: 2023-03-13 — End: 2023-04-19

## 2023-03-13 NOTE — Assessment & Plan Note (Signed)
Reviewed labs with patient.  Fasting insulin high at 19.6.  We discussed the pathophysiology of insulin resistance and risk of developing type 2 diabetes.  She is in the prediabetic range as well.  She has never used metformin.  She has recently reduced her intake of added sugar and refined carbohydrates on her prescribed dietary plan.  She is consistent with exercise 3 to 5 days a week.  She agrees to beginning metformin 500 mg once daily with food.  Continue prescribed dietary plan.  Avoid high sugar foods and drinks.

## 2023-03-13 NOTE — Assessment & Plan Note (Signed)
Lab Results  Component Value Date   CHOL 324 (H) 02/22/2023   HDL 58 02/22/2023   LDLCALC 223 (H) 02/22/2023   TRIG 216 (H) 02/22/2023   CHOLHDL 5.6 (H) 02/22/2023    She has never used medication for cholesterol.  We discussed her very high cholesterol results.  These were CCed to her PCP.  She is unsure of a positive family history for hyperlipidemia.  We discussed that with her very high LDL levels, she will unlikely achieve a target cholesterol reading with dietary change and weight loss.  She would likely require statin medication.  If statin not started by next visit, we will begin  The ASCVD Risk score (Arnett DK, et al., 2019) failed to calculate for the following reasons:   The valid total cholesterol range is 130 to 320 mg/dL

## 2023-03-13 NOTE — Assessment & Plan Note (Signed)
Chemistry      Component Value Date/Time   NA 141 02/22/2023 0838   K 4.5 02/22/2023 0838   CL 104 02/22/2023 0838   CO2 21 02/22/2023 0838   BUN 12 02/22/2023 0838   CREATININE 1.14 (H) 02/22/2023 0838   GLU 100 07/12/2022 0000      Component Value Date/Time   CALCIUM 9.7 02/22/2023 0838   ALKPHOS 74 02/22/2023 0838   AST 29 02/22/2023 0838   ALT 33 (H) 02/22/2023 0838   BILITOT 0.3 02/22/2023 1610      Reviewed lab results with patient.  Her GFR is 57.  Her renal function appears to be stable.  Her risk factors for kidney disease includes hypertension, poorly controlled at present time.  She denies frequent use of anti-inflammatories.  Repeat renal function in 3 months.  Encouraged good blood pressure  medication compliance

## 2023-03-13 NOTE — Patient Instructions (Addendum)
Try logging daily intake Aim for 1600 cal/ day This should include 100+ g/ of protein day  Begin metformin 500 mg once a day with breakfast Talk to your PCP about your HIGH cholesterol  Let's plan to repeat liver enzymes in 3 mos If still elevated, we will get a liver ultrasound  You can add in [employer] protein shakes (or [employer] shakes) as a snack If you swap out the sandwich for lunch, remember to have a lean protein, veggies/ fruit and ONE carb source

## 2023-03-13 NOTE — Progress Notes (Signed)
Office: [phone]  /  Fax: [phone]  WEIGHT SUMMARY AND BIOMETRICS  Starting Date: 02/22/23  Starting Weight: 236lb   Weight Lost Since Last Visit: 4lb   Vitals Temp: 98.2 F (36.8 C) BP: (!) 138/91 Pulse Rate: 88 SpO2: 95 %   Body Composition  Body Fat %: 45 % Fat Mass (lbs): 104.4 lbs Muscle Mass (lbs): 121.4 lbs Total Body Water (lbs): 89.8 lbs Visceral Fat Rating : 13   HPI  Chief Complaint: OBESITY  Amanda Stevens is here to discuss her progress with her obesity treatment plan. She is on the the Category 3 Plan and states she is following her eating plan approximately 98 % of the time. She states she is exercising 30-60 minutes 3 times per week.  Interval History:  Since last office visit she is down 4 lb She is really sticking to her meal plan She has really cut back on sugar intake She is feeling less hunger and cravings getting all of her meals in She made good choices when traveling She is consistent with exercise, cardio and weight training She is getting tired of eating eggs  Pharmacotherapy: none  PHYSICAL EXAM:  Blood pressure (!) 138/91, pulse 88, temperature 98.2 F (36.8 C), height 5\' 6"  (1.676 m), weight 232 lb (105.2 kg), SpO2 95%. Body mass index is 37.45 kg/m.  General: She is overweight, cooperative, alert, well developed, and in no acute distress. PSYCH: Has normal mood, affect and thought process.   Lungs: Normal breathing effort, no conversational dyspnea.   ASSESSMENT AND PLAN  TREATMENT PLAN FOR OBESITY:  Recommended Dietary Goals  Amanda Stevens is currently in the action stage of change. As such, her goal is to continue weight management plan. She has agreed to the Category 3 Plan and keeping a food journal and adhering to recommended goals of 1600 calories and 100 g of  protein.  Behavioral Intervention  We discussed the following Behavioral Modification Strategies today: increasing lean protein intake to established goals,  increasing vegetables, increasing lower glycemic fruits, increasing water intake , work on meal planning and preparation, keeping healthy foods at home, identifying sources and decreasing liquid calories, planning for success, better snacking choices, and continue to work on maintaining a reduced calorie state, getting the recommended amount of protein, incorporating whole foods, making healthy choices, staying well hydrated and practicing mindfulness when eating..  Additional resources provided today: NA  Recommended Physical Activity Goals  Amanda Stevens has been advised to work up to 150 minutes of moderate intensity aerobic activity a week and strengthening exercises 2-3 times per week for cardiovascular health, weight loss maintenance and preservation of muscle mass.   She has agreed to Work on scheduling and tracking physical activity.   Pharmacotherapy changes for the treatment of obesity: begin metformin 500 mg daily with food  ASSOCIATED CONDITIONS ADDRESSED TODAY  Prediabetes Assessment & Plan: Lab Results  Component Value Date   HGBA1C 6.1 (H) 02/22/2023   Reviewed labs with patient.  She is at risk for developing T2DM with a BMI of 37 and prediabetes.  Denies fam hx of T2DM.    She has recently reduced her intake of starches and sweets She is consistent with regular exercise  She agrees to starting metformin 500 mg once daily with food.  We discussed potential adverse side effects.  Orders: -     metFORMIN HCl; Take 1 tablet (500 mg total) by mouth daily with breakfast.  Dispense: 30 tablet; Refill: 0  Insulin resistance Assessment & Plan:  Reviewed labs with patient.  Fasting insulin high at 19.6.  We discussed the pathophysiology of insulin resistance and risk of developing type 2 diabetes.  She is in the prediabetic range as well.  She has never used metformin.  She has recently reduced her intake of added sugar and refined carbohydrates on her prescribed dietary plan.  She is  consistent with exercise 3 to 5 days a week.  She agrees to beginning metformin 500 mg once daily with food.  Continue prescribed dietary plan.  Avoid high sugar foods and drinks.  Orders: -     metFORMIN HCl; Take 1 tablet (500 mg total) by mouth daily with breakfast.  Dispense: 30 tablet; Refill: 0  Class 2 severe obesity due to excess calories with serious comorbidity and body mass index (BMI) of 37.0 to 37.9 in adult [facility])  Mixed hyperlipidemia Assessment & Plan: Lab Results  Component Value Date   CHOL 324 (H) 02/22/2023   HDL 58 02/22/2023   LDLCALC 223 (H) 02/22/2023   TRIG 216 (H) 02/22/2023   CHOLHDL 5.6 (H) 02/22/2023    She has never used medication for cholesterol.  We discussed her very high cholesterol results.  These were CCed to her PCP.  She is unsure of a positive family history for hyperlipidemia.  We discussed that with her very high LDL levels, she will unlikely achieve a target cholesterol reading with dietary change and weight loss.  She would likely require statin medication.  If statin not started by next visit, we will begin  The ASCVD Risk score (Arnett DK, et al., 2019) failed to calculate for the following reasons:   The valid total cholesterol range is 130 to 320 mg/dL    Essential hypertension Assessment & Plan: Blood pressure is elevated today.  She is currently on amlodipine 10 mg once daily and olmesartan 20 mg once daily.  She reports poor compliance taking her blood pressure medicine every day.  She has had occasional headaches.  Encouraged her to take all of her antihypertensive medication as directed every day.  Continue active plan for weight reduction.   Elevated ALT measurement Assessment & Plan: ALT mildly elevated at 33 on recent labs.  Reviewed results with patient today.  Previous CT scan reviewed from 07/16/2020, ordered by Dr. Loreta Ave include CT abdomen.  She has small gallstones but no findings of hepatic steatosis.  She denies  abdominal pain, postprandial nausea.  She still has her gallbladder.  She is actively working on weight reduction.  Plan to repeat hepatic function panel in 3 months.  If still elevated, will obtain a right upper quadrant ultrasound.   Stage 3a chronic kidney disease [facility]) Assessment & Plan:   Chemistry      Component Value Date/Time   NA 141 02/22/2023 0838   K 4.5 02/22/2023 0838   CL 104 02/22/2023 0838   CO2 21 02/22/2023 0838   BUN 12 02/22/2023 0838   CREATININE 1.14 (H) 02/22/2023 0838   GLU 100 07/12/2022 0000      Component Value Date/Time   CALCIUM 9.7 02/22/2023 0838   ALKPHOS 74 02/22/2023 0838   AST 29 02/22/2023 0838   ALT 33 (H) 02/22/2023 0838   BILITOT 0.3 02/22/2023 2130      Reviewed lab results with patient.  Her GFR is 57.  Her renal function appears to be stable.  Her risk factors for kidney disease includes hypertension, poorly controlled at present time.  She denies frequent use of anti-inflammatories.  Repeat renal function in 3 months.  Encouraged good blood pressure  medication compliance       She was informed of the importance of frequent follow up visits to maximize her success with intensive lifestyle modifications for her multiple health conditions.   ATTESTASTION STATEMENTS:  Reviewed by clinician on day of visit: allergies, medications, problem list, medical history, surgical history, family history, social history, and previous encounter notes pertinent to obesity diagnosis.   I have personally spent 30 minutes total time today in preparation, patient care, nutritional counseling and documentation for this visit, including the following: review of clinical lab tests; review of medical tests/procedures/services.      Glennis Brink, DO DABFM, DABOM [facility] [address] [place], [place] [address] [phone]

## 2023-03-13 NOTE — Assessment & Plan Note (Signed)
ALT mildly elevated at 33 on recent labs.  Reviewed results with patient today.  Previous CT scan reviewed from 07/16/2020, ordered by Dr. Loreta Ave include CT abdomen.  She has small gallstones but no findings of hepatic steatosis.  She denies abdominal pain, postprandial nausea.  She still has her gallbladder.  She is actively working on weight reduction.  Plan to repeat hepatic function panel in 3 months.  If still elevated, will obtain a right upper quadrant ultrasound.

## 2023-03-13 NOTE — Assessment & Plan Note (Signed)
Blood pressure is elevated today.  She is currently on amlodipine 10 mg once daily and olmesartan 20 mg once daily.  She reports poor compliance taking her blood pressure medicine every day.  She has had occasional headaches.  Encouraged her to take all of her antihypertensive medication as directed every day.  Continue active plan for weight reduction.

## 2023-03-13 NOTE — Assessment & Plan Note (Signed)
Lab Results  Component Value Date   HGBA1C 6.1 (H) 02/22/2023   Reviewed labs with patient.  She is at risk for developing T2DM with a BMI of 37 and prediabetes.  Denies fam hx of T2DM.    She has recently reduced her intake of starches and sweets She is consistent with regular exercise  She agrees to starting metformin 500 mg once daily with food.  We discussed potential adverse side effects.

## 2023-03-14 ENCOUNTER — Ambulatory Visit: Payer: 59 | Admitting: Family Medicine

## 2023-03-15 ENCOUNTER — Telehealth: Payer: Self-pay | Admitting: Family Medicine

## 2023-03-15 NOTE — Telephone Encounter (Signed)
LVM informing pt of no show status from 03/14/23 visit. I offered pt to call back for reschedule.

## 2023-03-19 ENCOUNTER — Ambulatory Visit: Payer: 59 | Admitting: Bariatrics

## 2023-03-19 ENCOUNTER — Ambulatory Visit: Payer: 59 | Admitting: Family Medicine

## 2023-04-03 ENCOUNTER — Ambulatory Visit: Payer: 59 | Admitting: Family Medicine

## 2023-04-05 ENCOUNTER — Encounter: Payer: Self-pay | Admitting: Family Medicine

## 2023-04-05 ENCOUNTER — Ambulatory Visit: Payer: 59 | Admitting: Family Medicine

## 2023-04-05 VITALS — BP 130/80 | HR 81 | Temp 98.2°F | Resp 18 | Ht 66.0 in | Wt 235.1 lb

## 2023-04-05 DIAGNOSIS — E782 Mixed hyperlipidemia: Secondary | ICD-10-CM

## 2023-04-05 DIAGNOSIS — R7303 Prediabetes: Secondary | ICD-10-CM

## 2023-04-05 DIAGNOSIS — I10 Essential (primary) hypertension: Secondary | ICD-10-CM | POA: Diagnosis not present

## 2023-04-05 LAB — COMPREHENSIVE METABOLIC PANEL WITH GFR
ALT: 17 U/L (ref 0–35)
AST: 22 U/L (ref 0–37)
Albumin: 4.3 g/dL (ref 3.5–5.2)
Alkaline Phosphatase: 63 U/L (ref 39–117)
BUN: 19 mg/dL (ref 6–23)
CO2: 24 meq/L (ref 19–32)
Calcium: 9.5 mg/dL (ref 8.4–10.5)
Chloride: 105 meq/L (ref 96–112)
Creatinine, Ser: 1 mg/dL (ref 0.40–1.20)
GFR: 63.8 mL/min
Glucose, Bld: 99 mg/dL (ref 70–99)
Potassium: 3.9 meq/L (ref 3.5–5.1)
Sodium: 138 meq/L (ref 135–145)
Total Bilirubin: 0.3 mg/dL (ref 0.2–1.2)
Total Protein: 7.6 g/dL (ref 6.0–8.3)

## 2023-04-05 LAB — LIPID PANEL
Cholesterol: 254 mg/dL — ABNORMAL HIGH (ref 0–200)
HDL: 52.6 mg/dL
LDL Cholesterol: 157 mg/dL — ABNORMAL HIGH (ref 0–99)
NonHDL: 201.32
Total CHOL/HDL Ratio: 5
Triglycerides: 224 mg/dL — ABNORMAL HIGH (ref 0.0–149.0)
VLDL: 44.8 mg/dL — ABNORMAL HIGH (ref 0.0–40.0)

## 2023-04-05 MED ORDER — OLMESARTAN MEDOXOMIL 20 MG PO TABS
20.0000 mg | ORAL_TABLET | Freq: Every day | ORAL | 0 refills | Status: DC
Start: 2023-04-05 — End: 2023-04-26

## 2023-04-05 MED ORDER — AMLODIPINE BESYLATE 5 MG PO TABS
5.0000 mg | ORAL_TABLET | Freq: Every day | ORAL | 0 refills | Status: DC
Start: 2023-04-05 — End: 2023-04-26

## 2023-04-05 NOTE — Progress Notes (Signed)
Subjective:     Patient ID: Amanda Stevens, female    DOB: August 28, 1970, 52 y.o.   MRN: [id number]  Chief Complaint  Patient presents with   Hypertension    Follow-up on blood pressure Not fasting Would like to check on cholesterol due to previous labs from [facility]    HPI  HTN - Pt is on Amlodipine 5 mg daily and Olmesartan 20 mg daily. She has been monitoring her BP, reports a reading of 130/80 this morning. Reports frequent headaches and migraines while on Metformin and notes her BP was also lower than usual, around 120/70s. She believes her lower BP readings could be associated to her being on Metformin. No side effects since stopping Metformin. Her edema has improved since decreasing her amlodipine from 10 mg to 5 mg at her last visit.  No dizziness/cp/palp/edema/cough/sob.  PreDM / Weight Management - Was last seen at [facility] by Dr. Cathey Endow on 10/29 and was started on Metformin 500 mg. Stopped Metformin after reporting associated frequent headaches on 11/18. She is working hard on exercise and healthy diet. She has been exercising 3-4x/week. States she genuinely enjoys weight training and doing zumba. Maintains a healthy diet and is mindful with her eating habits. She has been more intentional about increasing her protein intake.  HLD - She reports elevated levels in her recent lipid panel. As of 10/10 her LDL was 223, Trig 216, and tot chol 324. This has caused her concern given she exercises regularly, maintains a healthy diet, and has no fmhx of HLD. Notes that she is the healthiest person in her family, has maintained healthy diet over the last 20 years. Avoids greasy and fatty foods, states she "can't stomach it" and it causes GI upset. She is concerned that her increased protein intake could be contributing to her elevated cholesterol. Has been eating more meat and cheese than usual. Endorses bubbles in her urine. Pt has confirmed no fmhx of HLD with other  family members just to be sure.   Non-fasting -- last meal was around 8 AM. Had a low carb bread with 3 eggs and a cup of milk.   Follows up with nephrology every 6 months.  Pt voiced frustrations again about doing everything right and still needing meds for bp and now possibly for cholesterol.  I voiced understanding and validated frustrations.  Advised, at times, things are genetic and all the healthy lifestyle changes may not help(but continue to do them).  Also, validated frustrations w/wt loss efforts but no results.     Health Maintenance Due  Topic Date Due   HIV Screening  Never done   Hepatitis C Screening  Never done   Cervical Cancer Screening (HPV/Pap Cotest)  Never done    Past Medical History:  Diagnosis Date   Blood transfusion without reported diagnosis 2013   I had a hysterectomy and lost blood   Constipation    Depression 02/22/2023   Hypertension    Joint pain    Mixed hyperlipidemia 03/13/2023    Past Surgical History:  Procedure Laterality Date   ABDOMINAL HYSTERECTOMY  2013   fibroids   MYOMECTOMY     PARTIAL HYSTERECTOMY       Current Outpatient Medications:    Black Currant Seed Oil 500 MG CAPS, Take by mouth., Disp: , Rfl:    metFORMIN (GLUCOPHAGE) 500 MG tablet, Take 1 tablet (500 mg total) by mouth daily with breakfast., Disp: 30 tablet, Rfl: 0  multivitamin-iron-minerals-folic acid (CENTRUM) chewable tablet, Chew 1 tablet by mouth daily., Disp: , Rfl:    Omega-3 1000 MG CAPS, Take by mouth., Disp: , Rfl:    Progesterone Micronized (PROGESTERONE COMPOUNDING KIT) 20 % CREA, Place onto the skin., Disp: , Rfl:    amLODipine (NORVASC) 5 MG tablet, Take 1 tablet (5 mg total) by mouth daily., Disp: 90 tablet, Rfl: 0   olmesartan (BENICAR) 20 MG tablet, Take 1 tablet (20 mg total) by mouth daily., Disp: 90 tablet, Rfl: 0  Allergies  Allergen Reactions   Egg Correll (Egg Protein) Other (See Comments)   Flavoring Agent Other (See Comments)   ROS  neg/noncontributory except as noted HPI/below      Objective:     BP 130/80 Comment: has already taking medication  Pulse 81   Temp 98.2 F (36.8 C) (Temporal)   Resp 18   Ht 5\' 6"  (1.676 m)   Wt 235 lb 2 oz (106.7 kg)   LMP  (LMP Unknown)   SpO2 98%   BMI 37.95 kg/m  Wt Readings from Last 3 Encounters:  04/05/23 235 lb 2 oz (106.7 kg)  03/13/23 232 lb (105.2 kg)  02/22/23 236 lb (107 kg)    Physical Exam   Gen: WDWN NAD HEENT: NCAT, conjunctiva not injected, sclera nonicteric NECK:  supple, no thyromegaly, no nodes, no carotid bruits CARDIAC: RRR, S1S2+, no murmur. DP 2+B LUNGS: CTAB. No wheezes EXT:  no edema MSK: no gross abnormalities.  NEURO: A&O x3.  CN II-XII intact.  PSYCH: normal mood. Good eye contact  Reviewed labs from 10/10.    Assessment & Plan:  Essential hypertension Assessment & Plan: Chronic.  Controlled mostly now.  Continue amlodipine 5mg  and olmesartan 20mg .  Keep log of bp's for 2 wks and send.  May need to increase olmesartan to 40mg . Advised may have to be on meds despite weight loss.    Orders: -     Comprehensive metabolic panel -     Lipid panel -     amLODIPine Besylate; Take 1 tablet (5 mg total) by mouth daily.  Dispense: 90 tablet; Refill: 0 -     Olmesartan Medoxomil; Take 1 tablet (20 mg total) by mouth daily.  Dispense: 90 tablet; Refill: 0  Prediabetes Assessment & Plan: Chronic.  Pt working hard on diet/exercise.  Continue.     Mixed hyperlipidemia Assessment & Plan: New dx.  Labs from 1 yr ago-LDL 131 TC 222 HDL 65 trigs 149.  Not sure if the one from last month was reflective of new high protein diet or why such a change.  Pt not liking idea of having to take meds, but not liking risks either.  She's VERY frustrated.  Will reck lipids to get status.  She will f/u w/wt loss clinic as we dont' want a lot of conflicting ideas.     Pt would like to wait for 3 mo for f/u w/me as overwhelmed w/mult appointments.    Return in  about 3 months (around 07/06/2023) for HTN.   I, Isabelle Course, acting as a scribe for Angelena Sole, MD., have documented all relevant documentation on the behalf of Angelena Sole, MD, as directed by  Angelena Sole, MD while in the presence of Angelena Sole, MD.  I, Angelena Sole, MD, have reviewed all documentation for this visit. The documentation on 04/05/23 for the exam, diagnosis, procedures, and orders are all accurate and complete.  Angelena Sole, MD

## 2023-04-05 NOTE — Progress Notes (Signed)
Liver and cholesterol some better.  Of course, cholesterol still needs work.  Will defer to wt loss clinic

## 2023-04-05 NOTE — Patient Instructions (Signed)

## 2023-04-05 NOTE — Assessment & Plan Note (Signed)
Chronic.  Controlled mostly now.  Continue amlodipine 5mg  and olmesartan 20mg .  Keep log of bp's for 2 wks and send.  May need to increase olmesartan to 40mg . Advised may have to be on meds despite weight loss.

## 2023-04-05 NOTE — Assessment & Plan Note (Signed)
New dx.  Labs from 1 yr ago-LDL 131 TC 222 HDL 65 trigs 149.  Not sure if the one from last month was reflective of new high protein diet or why such a change.  Pt not liking idea of having to take meds, but not liking risks either.  She's VERY frustrated.  Will reck lipids to get status.  She will f/u w/wt loss clinic as we dont' want a lot of conflicting ideas.

## 2023-04-05 NOTE — Assessment & Plan Note (Signed)
Chronic.  Pt working hard on diet/exercise.  Continue.

## 2023-04-19 ENCOUNTER — Other Ambulatory Visit: Payer: Self-pay | Admitting: Family Medicine

## 2023-04-19 ENCOUNTER — Ambulatory Visit: Payer: 59 | Admitting: Family Medicine

## 2023-04-19 ENCOUNTER — Encounter: Payer: Self-pay | Admitting: Family Medicine

## 2023-04-19 VITALS — BP 139/82 | HR 91 | Temp 98.9°F | Ht 66.0 in | Wt 234.0 lb

## 2023-04-19 DIAGNOSIS — R7303 Prediabetes: Secondary | ICD-10-CM

## 2023-04-19 DIAGNOSIS — E782 Mixed hyperlipidemia: Secondary | ICD-10-CM | POA: Diagnosis not present

## 2023-04-19 DIAGNOSIS — E66812 Obesity, class 2: Secondary | ICD-10-CM | POA: Diagnosis not present

## 2023-04-19 DIAGNOSIS — E88819 Insulin resistance, unspecified: Secondary | ICD-10-CM

## 2023-04-19 DIAGNOSIS — Z6837 Body mass index (BMI) 37.0-37.9, adult: Secondary | ICD-10-CM

## 2023-04-19 DIAGNOSIS — F3289 Other specified depressive episodes: Secondary | ICD-10-CM | POA: Diagnosis not present

## 2023-04-19 MED ORDER — METFORMIN HCL 500 MG PO TABS
500.0000 mg | ORAL_TABLET | Freq: Every day | ORAL | 0 refills | Status: DC
Start: 2023-04-19 — End: 2023-09-24

## 2023-04-19 NOTE — Progress Notes (Signed)
Office: [phone]  /  Fax: [phone]  WEIGHT SUMMARY AND BIOMETRICS  Starting Date: 02/22/23  Starting Weight: 236lb   Weight Lost Since Last Visit: 0lb   Vitals Temp: 98.9 F (37.2 C) BP: 139/82 Pulse Rate: 91 SpO2: 99 %   Body Composition  Body Fat %: 44.3 % Fat Mass (lbs): 103.8 lbs Muscle Mass (lbs): 124 lbs Total Body Water (lbs): 88.6 lbs Visceral Fat Rating : 13    HPI  Chief Complaint: OBESITY  Amanda Stevens is here to discuss her progress with her obesity treatment plan. She is on the the Category 3 Plan and states she is following her eating plan approximately 75 % of the time. She states she is exercising 30 minutes 2 times per week.   Interval History:  Since last office visit she is up 2 lb She is tracking her calories most days days She has started to journal She has started to strength train 2 days/ wk, line dancing, zumba She is getting ~1600 cal/ day and has been getting ~ 150 g of protein daily She has a net weight loss of 2 lb in the past 8 weeks of medically supervised weight management She moved metformin 500 mg daily to morning and HA's have improved She notes over consuming sweets on days that she was sleep deprived and this worsened by meal skipping Taking magnesium glycinate has improved sleep  Pharmacotherapy: metformin 500 mg daily for IR/ PDM  PHYSICAL EXAM:  Blood pressure 139/82, pulse 91, temperature 98.9 F (37.2 C), height 5\' 6"  (1.676 m), weight 234 lb (106.1 kg), SpO2 99%. Body mass index is 37.77 kg/m.  General: She is overweight, cooperative, alert, well developed, and in no acute distress. PSYCH: Has normal mood, affect and thought process.   Lungs: Normal breathing effort, no conversational dyspnea.   ASSESSMENT AND PLAN  TREATMENT PLAN FOR OBESITY:  Recommended Dietary Goals  Jarin is currently in the action stage of change. As such, her goal is to continue weight management plan. She has agreed to keeping  a food journal and adhering to recommended goals of 1600 calories and 90-120 g protein. - SHE WAS OVER ON HER DAILY PROTEIN RECOMMENDATIONS.  RECOMMEND 90-120 G/ DAY  Behavioral Intervention  We discussed the following Behavioral Modification Strategies today: increasing vegetables, increasing lower glycemic fruits, increasing fiber rich foods, avoiding skipping meals, increasing water intake , work on meal planning and preparation, work on Counselling psychologist calories using tracking application, keeping healthy foods at home, work on managing stress, creating time for self-care and relaxation, avoiding temptations and identifying enticing environmental cues, continue to work on implementation of reduced calorie nutritional plan, planning for success, better snacking choices, and continue to work on maintaining a reduced calorie state, getting the recommended amount of protein, incorporating whole foods, making healthy choices, staying well hydrated and practicing mindfulness when eating.. - KEEP JUNK FOODS OUT OF THE HOUSE - EAT ON A SCHEDULE  - CONTINUE TO JOURNAL  Additional resources provided today: NA  Recommended Physical Activity Goals  [place] has been advised to work up to 150 minutes of moderate intensity aerobic activity a week and strengthening exercises 2-3 times per week for cardiovascular health, weight loss maintenance and preservation of muscle mass.   She has agreed to Increase the intensity, frequency or duration of strengthening exercises  and Increase the intensity, frequency or duration of aerobic exercises    Pharmacotherapy changes for the treatment of obesity:  NONE  ASSOCIATED CONDITIONS ADDRESSED  TODAY  Prediabetes Assessment & Plan: Lab Results  Component Value Date   HGBA1C 6.1 (H) 02/22/2023   Doing fairly well with dietary changes but has been inconsistent due to mood, poor sleep, meal skipping. She is making strides in the right direction, feeling  better with lean protein + fiber with meals and reducing high sugar items She is fairly consistent with exercise as well She is tolerating metformin 500 mg in the morning with food well  Repeat labs next visit Continue lifestyle changes as directed and continue metformin at 500 mg daily   Orders: -     metFORMIN HCl; Take 1 tablet (500 mg total) by mouth daily with breakfast.  Dispense: 30 tablet; Refill: 0  Insulin resistance -     metFORMIN HCl; Take 1 tablet (500 mg total) by mouth daily with breakfast.  Dispense: 30 tablet; Refill: 0  Class 2 severe obesity due to excess calories with serious comorbidity and body mass index (BMI) of 37.0 to 37.9 in adult [facility])  Mixed hyperlipidemia Assessment & Plan: Lab Results  Component Value Date   CHOL 254 (H) 04/05/2023   HDL 52.60 04/05/2023   LDLCALC 157 (H) 04/05/2023   TRIG 224.0 (H) 04/05/2023   CHOLHDL 5 04/05/2023   Lipids did improve on recheck with PCP in Nov She denies a fam hx of HLD but denies heavy intake of high saturated fat foods or sweets  We discussed that cholesterol can be high with acute stressors, hormone changes and genetics even with a pristine diet   Other depression with emotional eating Assessment & Plan: She notes that emotional eating continue to be a factor in her progress She is journaling and this is very helpful She has a fair support system  Consider adding CBT with Dr Dewaine Conger Consider future use of Wellbutrin - she declined today       She was informed of the importance of frequent follow up visits to maximize her success with intensive lifestyle modifications for her multiple health conditions.   ATTESTASTION STATEMENTS:  Reviewed by clinician on day of visit: allergies, medications, problem list, medical history, surgical history, family history, social history, and previous encounter notes pertinent to obesity diagnosis.   I have personally spent 30 minutes total time today in  preparation, patient care, nutritional counseling and documentation for this visit, including the following: review of clinical lab tests; review of medical tests/procedures/services.      Glennis Brink, DO DABFM, DABOM [facility] [address] [place], [place] [address] [phone]

## 2023-04-19 NOTE — Patient Instructions (Addendum)
Check out Chat GPT for meal and snack ideas  Keep up the good work with workouts!  Continue to journal Aim for a good 1600 cal/ day which should include 90-120 g of protein daily (Try to not go over 130 g/ day)\ Do not add your workouts into the AP  Limit high sugar items Remember your fruits and veggies daily  Try to swap out Crystal Light (with aspartame) to True Lemon or Crystal light Pure

## 2023-04-19 NOTE — Assessment & Plan Note (Signed)
Lab Results  Component Value Date   HGBA1C 6.1 (H) 02/22/2023   Doing fairly well with dietary changes but has been inconsistent due to mood, poor sleep, meal skipping. She is making strides in the right direction, feeling better with lean protein + fiber with meals and reducing high sugar items She is fairly consistent with exercise as well She is tolerating metformin 500 mg in the morning with food well  Repeat labs next visit Continue lifestyle changes as directed and continue metformin at 500 mg daily

## 2023-04-19 NOTE — Assessment & Plan Note (Signed)
Lab Results  Component Value Date   CHOL 254 (H) 04/05/2023   HDL 52.60 04/05/2023   LDLCALC 157 (H) 04/05/2023   TRIG 224.0 (H) 04/05/2023   CHOLHDL 5 04/05/2023   Lipids did improve on recheck with PCP in Nov She denies a fam hx of HLD but denies heavy intake of high saturated fat foods or sweets  We discussed that cholesterol can be high with acute stressors, hormone changes and genetics even with a pristine diet

## 2023-04-19 NOTE — Assessment & Plan Note (Signed)
She notes that emotional eating continue to be a factor in her progress She is journaling and this is very helpful She has a fair support system  Consider adding CBT with Dr Dewaine Conger Consider future use of Wellbutrin - she declined today

## 2023-04-26 ENCOUNTER — Other Ambulatory Visit: Payer: Self-pay | Admitting: Family Medicine

## 2023-04-26 ENCOUNTER — Ambulatory Visit: Payer: 59 | Admitting: Family Medicine

## 2023-04-26 DIAGNOSIS — I10 Essential (primary) hypertension: Secondary | ICD-10-CM

## 2023-04-26 MED ORDER — OLMESARTAN MEDOXOMIL 20 MG PO TABS: 20.0000 mg | ORAL_TABLET | Freq: Every day | ORAL | 0 refills | Status: DC

## 2023-04-26 MED ORDER — AMLODIPINE BESYLATE 5 MG PO TABS: 5.0000 mg | ORAL_TABLET | Freq: Every day | ORAL | 0 refills | Status: DC

## 2023-05-22 ENCOUNTER — Other Ambulatory Visit: Payer: Self-pay | Admitting: Family Medicine

## 2023-05-22 ENCOUNTER — Ambulatory Visit: Payer: 59 | Admitting: Family Medicine

## 2023-05-22 DIAGNOSIS — R7303 Prediabetes: Secondary | ICD-10-CM

## 2023-05-22 DIAGNOSIS — I10 Essential (primary) hypertension: Secondary | ICD-10-CM

## 2023-05-22 DIAGNOSIS — E88819 Insulin resistance, unspecified: Secondary | ICD-10-CM

## 2023-05-22 MED ORDER — AMLODIPINE BESYLATE 5 MG PO TABS: 5.0000 mg | ORAL_TABLET | Freq: Every day | ORAL | 0 refills | Status: DC

## 2023-05-22 MED ORDER — OLMESARTAN MEDOXOMIL 20 MG PO TABS: 20.0000 mg | ORAL_TABLET | Freq: Every day | ORAL | 0 refills | Status: DC

## 2023-05-22 NOTE — Telephone Encounter (Signed)
 Copied from CRM [id number]. Topic: Clinical - Prescription Issue >> May 22, 2023 11:45 AM Amanda Stevens wrote: Reason for CRM: Patient is calling in about medication , stating that it was denied at [facility], and needs some clarification about her medication  requesting someone to give her call back to assist with this issue  Spoke with patient and she stated that the pharmacy only gave her a 30 day supply on 04/26/23 and not the 90 that was sent by pcp. Rx's sent to the pharmacy. 90/0.

## 2023-06-08 ENCOUNTER — Telehealth: Payer: Self-pay | Admitting: Family Medicine

## 2023-06-08 NOTE — Telephone Encounter (Signed)
Tried calling patient, unable to leave a message, mailbox full.

## 2023-06-08 NOTE — Telephone Encounter (Signed)
Patient is calling in regards to both of her bp medications she did not get a 30 day supply she only got a 90 day supply

## 2023-06-11 ENCOUNTER — Encounter: Payer: Self-pay | Admitting: *Deleted

## 2023-06-11 NOTE — Telephone Encounter (Signed)
Patient stated that she wanted to let me know that the pharmacy did not give her a 90 day supply of her medications, they gave her 30 again. Spoke to pharmacy and they stated that they gave her refill from a previous 30 day prescription. They stated they could fill the new rx and have it ready for patient on Wednesday.

## 2023-07-03 ENCOUNTER — Other Ambulatory Visit: Payer: Self-pay | Admitting: Family Medicine

## 2023-07-03 DIAGNOSIS — I10 Essential (primary) hypertension: Secondary | ICD-10-CM

## 2023-07-03 NOTE — Telephone Encounter (Signed)
 Needs appt

## 2023-08-08 ENCOUNTER — Other Ambulatory Visit: Payer: Self-pay | Admitting: Family Medicine

## 2023-08-08 ENCOUNTER — Encounter: Payer: Self-pay | Admitting: *Deleted

## 2023-08-08 ENCOUNTER — Telehealth: Payer: Self-pay | Admitting: Family Medicine

## 2023-08-08 DIAGNOSIS — I10 Essential (primary) hypertension: Secondary | ICD-10-CM

## 2023-08-08 MED ORDER — OLMESARTAN MEDOXOMIL 20 MG PO TABS: 20.0000 mg | ORAL_TABLET | Freq: Every day | ORAL | 0 refills | Status: DC

## 2023-08-08 MED ORDER — AMLODIPINE BESYLATE 5 MG PO TABS
5.0000 mg | ORAL_TABLET | Freq: Every day | ORAL | 0 refills | Status: DC
Start: 2023-08-08 — End: 2023-09-24

## 2023-08-08 NOTE — Telephone Encounter (Signed)
 Please schedule patient for sooner appointment, there are available appointments for Monday, also an 1130 for Friday this week.

## 2023-08-08 NOTE — Telephone Encounter (Signed)
 Left message for patient to call and get earlier appt.

## 2023-08-08 NOTE — Telephone Encounter (Signed)
 Unable to LVM, MB is full. Sending MyChart msg to schedule an ov

## 2023-08-08 NOTE — Telephone Encounter (Signed)
 Needs sooner appt than May

## 2023-09-11 ENCOUNTER — Telehealth: Admitting: Family Medicine

## 2023-09-17 DIAGNOSIS — R635 Abnormal weight gain: Secondary | ICD-10-CM | POA: Insufficient documentation

## 2023-09-24 ENCOUNTER — Encounter: Payer: Self-pay | Admitting: Family Medicine

## 2023-09-24 ENCOUNTER — Ambulatory Visit: Admitting: Family Medicine

## 2023-09-24 VITALS — BP 118/82 | HR 84 | Temp 97.3°F | Ht 66.0 in | Wt 240.8 lb

## 2023-09-24 DIAGNOSIS — K5904 Chronic idiopathic constipation: Secondary | ICD-10-CM | POA: Insufficient documentation

## 2023-09-24 DIAGNOSIS — I10 Essential (primary) hypertension: Secondary | ICD-10-CM

## 2023-09-24 DIAGNOSIS — Z0000 Encounter for general adult medical examination without abnormal findings: Secondary | ICD-10-CM

## 2023-09-24 DIAGNOSIS — Z23 Encounter for immunization: Secondary | ICD-10-CM | POA: Diagnosis not present

## 2023-09-24 DIAGNOSIS — K219 Gastro-esophageal reflux disease without esophagitis: Secondary | ICD-10-CM

## 2023-09-24 DIAGNOSIS — Z1211 Encounter for screening for malignant neoplasm of colon: Secondary | ICD-10-CM | POA: Insufficient documentation

## 2023-09-24 DIAGNOSIS — R194 Change in bowel habit: Secondary | ICD-10-CM | POA: Insufficient documentation

## 2023-09-24 HISTORY — DX: Gastro-esophageal reflux disease without esophagitis: K21.9

## 2023-09-24 MED ORDER — AMLODIPINE BESYLATE 5 MG PO TABS: 5.0000 mg | ORAL_TABLET | Freq: Every day | ORAL | 0 refills | Status: DC

## 2023-09-24 MED ORDER — OLMESARTAN MEDOXOMIL 20 MG PO TABS: 20.0000 mg | ORAL_TABLET | Freq: Every day | ORAL | 0 refills | Status: DC

## 2023-09-24 NOTE — Progress Notes (Signed)
 Phone [phone]   Subjective:   Patient is a 53 y.o. female presenting for annual physical.    Chief Complaint  Patient presents with   Hypertension    Pt in office for annual CPE but still having issues with BP and medication not working; pt states been corresponding via MyChart;  pt had breakfast not fasting;    Annual-exercises.  Working on diet Discussed the use of AI scribe software for clinical note transcription with the patient, who gave verbal consent to proceed.  History of Present Illness Amanda Stevens is a 52 year old female with hypertension and prediabetes who presents for medication management and weight loss consultation.  She is currently taking amlodipine  5 mg daily and olmesartan  20 mg daily for hypertension. Her prescriptions are running low due to expiration, and she needs a renewal. Her blood pressure was unexpectedly low today, which she attributes to a new supplement, Shilajit. She recalls a significant spike in her blood pressure in February during a stressful event, which she attributes to dehydration and stress.  She has a history of prediabetes and is concerned about the use of metformin  due to potential kidney effects. She monitors her kidney function closely, watching for signs such as bubbles in her urine. She is interested in weight loss to potentially reduce her need for medications, aiming to lose 40-50 pounds. She follows a strict diet, avoiding sugar and focusing on protein and vegetables, and maintains a high level of physical activity, including strength training and cardio exercises. Would really like to get off meds.  Aware that still may need bp meds, but wants to see what wt loss can accomplish.  She has Rx for compounded buccal zepbound that she's considering using for 3-4 month to see if can help.   She has a history of a hysterectomy and notes difficulty in losing weight despite her efforts, suspecting hormonal factors and insulin  resistance. She  has reduced her weight from 244 pounds in March to 240 pounds currently. She is committed to her exercise regimen, which includes strength training and cardio activities like Zumba and line dancing, and aims to achieve further weight loss to manage her blood pressure and prediabetes.  No headaches, dizziness, chest pain, heart palpitations, nausea, vomiting, diarrhea, constipation, depression, or arthritis. She reports occasional toe pain after a past fall, particularly when her blood pressure spikes. She recently had blood work done to check her thyroid function, but no recent A1c test since October.    See problem oriented charting- ROS- ROS: Gen: no fever, chills  Skin: no rash, itching ENT: no ear pain, ear drainage, nasal congestion, rhinorrhea, sinus pressure, sore throat Eyes: no blurry vision, double vision Resp: no cough, wheeze,SOB CV: no CP, palpitations, LE edema,  GI: no heartburn, n/v/d/c, abd pain GU: no dysuria, urgency, frequency, hematuria MSK: no joint pain, myalgias, back pain  some toes-from fall 1 yr ago Neuro: no dizziness, headache, weakness, vertigo Psych: no depression, anxiety, insomnia, SI   The following were reviewed and entered/updated in epic: Past Medical History:  Diagnosis Date   Blood transfusion without reported diagnosis 2013   I had a hysterectomy and lost blood   Constipation    Depression 02/22/2023   Gastroesophageal reflux disease 09/24/2023   Hypertension    Joint pain    Mixed hyperlipidemia 03/13/2023   Patient Active Problem List   Diagnosis Date Noted   Change in bowel habit 09/24/2023   Chronic idiopathic constipation 09/24/2023   Gastroesophageal reflux disease  09/24/2023   Morbid obesity (HCC) 09/24/2023   Colon cancer screening 09/24/2023   Abnormal weight gain 09/17/2023   Mixed hyperlipidemia 03/13/2023   Stage 3a chronic kidney disease (HCC) 03/13/2023   Insulin  resistance 03/13/2023   Elevated ALT measurement 03/13/2023    Other fatigue 02/22/2023   SOBOE (shortness of breath on exertion) 02/22/2023   Depression 02/22/2023   Snoring 02/22/2023   Polyphagia 02/22/2023   Obesity with body mass index 30 or greater 02/22/2023   Generalized obesity with starting BMI 38.1 02/22/2023   Depression screen 02/22/2023   Hypertensive disorder 06/05/2022   Prediabetes 06/05/2022   Menopausal symptom 05/20/2021   History of total hysterectomy 07/21/2020   Left lower quadrant pain 07/21/2020   Menopausal and female climacteric states 07/21/2020   Past Surgical History:  Procedure Laterality Date   ABDOMINAL HYSTERECTOMY  2013   fibroids   MYOMECTOMY     PARTIAL HYSTERECTOMY      Family History  Problem Relation Age of Onset   Cancer Mother    Hypertension Mother    Liver cancer Mother    Obesity Mother    Hypertension Father    Depression Father    Vision loss Father    Obesity Sister     Medications- reviewed and updated Current Outpatient Medications  Medication Sig Dispense Refill   Black Currant Seed Oil 500 MG CAPS Take by mouth.     multivitamin-iron-minerals-folic acid (CENTRUM) chewable tablet Chew 1 tablet by mouth daily.     Omega-3 1000 MG CAPS Take by mouth.     Progesterone Micronized (PROGESTERONE COMPOUNDING KIT) 20 % CREA Place onto the skin.     amLODipine  (NORVASC ) 5 MG tablet Take 1 tablet (5 mg total) by mouth daily. 90 tablet 0   olmesartan  (BENICAR ) 20 MG tablet Take 1 tablet (20 mg total) by mouth daily. 90 tablet 0   No current facility-administered medications for this visit.    Allergies-reviewed and updated Allergies  Allergen Reactions   Egg [place] (Egg Protein) Other (See Comments)    egg [place] (chicken) allergenic extract   Flavoring Agent Other (See Comments)   Flavoring Agent (Non-Screening) Other (See Comments)    Social History   Social History Narrative   ** Merged History Encounter **       Objective  Objective:  BP 118/82 (BP Location: Left Arm,  Patient Position: Sitting, Cuff Size: Large) Comment: Manually  Pulse 84   Temp (!) 97.3 F (36.3 C) (Temporal)   Ht 5\' 6"  (1.676 m)   Wt 240 lb 12.8 oz (109.2 kg)   LMP  (LMP Unknown)   SpO2 97%   BMI 38.87 kg/m  Physical Exam  Gen: WDWN NAD HEENT: NCAT, conjunctiva not injected, sclera nonicteric TM WNL B, OP moist, no exudates  NECK:  supple, no thyromegaly, no nodes, no carotid bruits CARDIAC: RRR, S1S2+, no murmur. DP 2+B LUNGS: CTAB. No wheezes ABDOMEN:  BS+, soft, NTND, No HSM, no masses EXT:  no edema MSK: no gross abnormalities. MS 5/5 all 4 NEURO: A&O x3.  CN II-XII intact.  PSYCH: normal mood. Good eye contact     Assessment and Plan   Health Maintenance counseling: 1. Anticipatory guidance: Patient counseled regarding regular dental exams q6 months, eye exams,  avoiding smoking and second hand smoke, limiting alcohol to 1 beverage per day, no illicit drugs.   2. Risk factor reduction:  Advised patient of need for regular exercise and diet rich and fruits and vegetables  to reduce risk of heart attack and stroke. Exercise- +.  Wt Readings from Last 3 Encounters:  09/24/23 240 lb 12.8 oz (109.2 kg)  04/19/23 234 lb (106.1 kg)  04/05/23 235 lb 2 oz (106.7 kg)   3. Immunizations/screenings/ancillary studies Immunization History  Administered Date(s) Administered   Influenza Inj Mdck Quad Pf 01/14/2022   Influenza, Seasonal, Injecte, Preservative Fre 05/11/2023   Influenza,inj,Quad PF,6+ Mos 02/11/2022   Moderna Covid-19 Vaccine Bivalent Booster 48yrs & up 08/03/2019   Moderna Sars-Covid-2 Vaccination 08/12/2019, 09/06/2019, 05/13/2020   PNEUMOCOCCAL CONJUGATE-20 09/24/2023   There are no preventive care reminders to display for this patient.  4. Cervical cancer screening- utd 5. Breast cancer screening-  mammogram utd 6. Colon cancer screening - utd 7. Skin cancer screening- advised regular sunscreen use. Denies worrisome, changing, or new skin lesions.  8.  Birth control/STD check- n/a 9. Osteoporosis screening- n/a 10. Smoking associated screening - non smoker  Annual physical exam -     CBC with Differential/Platelet -     Comprehensive metabolic panel with GFR -     Hemoglobin A1c -     Lipid panel  Essential hypertension -     amLODIPine  Besylate; Take 1 tablet (5 mg total) by mouth daily.  Dispense: 90 tablet; Refill: 0 -     Olmesartan  Medoxomil; Take 1 tablet (20 mg total) by mouth daily.  Dispense: 90 tablet; Refill: 0  Need for pneumococcal vaccine -     Pneumococcal conjugate vaccine 20-valent   Wellness-anticipatory guidance.  Work on Diet/Exercise  Check CBC,CMP,lipids,TSH, A1C.  F/u 1 yr  Assessment and Plan Assessment & Plan Wellness Visit   During her routine wellness visit, her current health status, lifestyle, and preventive care were discussed. She is up to date on mammograms and colon cancer screenings. The importance of pneumonia and shingles vaccines was emphasized. Administer the pneumonia vaccine today and schedule the shingles vaccine for the next visit in three months. Order an A1c test to monitor her prediabetes status.  Hypertension   Her hypertension is well-controlled with amlodipine  5 mg and olmesartan  20 mg daily. Today's blood pressure was low, possibly due to the Shilajit supplement. Previous spikes were linked to stress and dehydration. She is committed to lifestyle modifications. Continue amlodipine  and olmesartan  at current doses. Monitor blood pressure regularly and reassess control in three months.  Prediabetes   Prediabetes is managed with lifestyle modifications, including a low-sugar diet and regular exercise. She is interested in tazepatide for weight loss to improve insulin  resistance and reduce medication need. Realistic weight loss goals and the time required for tazepatide effects were discussed. Order an A1c test to monitor glucose control. Discuss the potential use of tazepatide and reassess  management in three months.  Obesity   Obesity is addressed through dietary changes, increased physical activity, and consideration of tazepatide for weight loss. She aims to lose 40 pounds but acknowledges that 20 pounds in four months is more realistic. Strength training was discussed to maintain muscle mass during weight loss. Discuss the potential use of zepbound compound, encourage continued dietary modifications and exercise, and reassess weight loss progress in three months.  Hysterectomy   She has had a hysterectomy with no current related issues.    Recommended follow up: Return in about 3 months (around 12/25/2023) for chronic follow-up.  Lab/Order associations:+ fasting  Ellsworth Haas, MD

## 2023-09-24 NOTE — Patient Instructions (Signed)

## 2023-09-25 ENCOUNTER — Ambulatory Visit: Payer: Self-pay | Admitting: Family Medicine

## 2023-09-25 LAB — COMPREHENSIVE METABOLIC PANEL WITH GFR
ALT: 16 U/L (ref 0–35)
AST: 19 U/L (ref 0–37)
Albumin: 4.3 g/dL (ref 3.5–5.2)
Alkaline Phosphatase: 44 U/L (ref 39–117)
BUN: 18 mg/dL (ref 6–23)
CO2: 24 meq/L (ref 19–32)
Calcium: 9.2 mg/dL (ref 8.4–10.5)
Chloride: 106 meq/L (ref 96–112)
Creatinine, Ser: 1.1 mg/dL (ref 0.40–1.20)
GFR: 56.71 mL/min — ABNORMAL LOW (ref >60.00)
Glucose, Bld: 87 mg/dL (ref 70–99)
Potassium: 4.4 meq/L (ref 3.5–5.1)
Sodium: 138 meq/L (ref 135–145)
Total Bilirubin: 0.4 mg/dL (ref 0.2–1.2)
Total Protein: 7.6 g/dL (ref 6.0–8.3)

## 2023-09-25 LAB — LIPID PANEL
Cholesterol: 206 mg/dL — ABNORMAL HIGH (ref 0–200)
HDL: 60.1 mg/dL (ref >39.00)
LDL Cholesterol: 125 mg/dL — ABNORMAL HIGH (ref 0–99)
NonHDL: 145.55
Total CHOL/HDL Ratio: 3
Triglycerides: 103 mg/dL (ref 0.0–149.0)
VLDL: 20.6 mg/dL (ref 0.0–40.0)

## 2023-09-25 LAB — CBC WITH DIFFERENTIAL/PLATELET
Basophils Absolute: 0 10*3/uL (ref 0.0–0.1)
Basophils Relative: 0.3 % (ref 0.0–3.0)
Eosinophils Absolute: 0.1 10*3/uL (ref 0.0–0.7)
Eosinophils Relative: 2.7 % (ref 0.0–5.0)
HCT: 43.2 % (ref 36.0–46.0)
Hemoglobin: 13.9 g/dL (ref 12.0–15.0)
Lymphocytes Relative: 37.8 % (ref 12.0–46.0)
Lymphs Abs: 1.9 10*3/uL (ref 0.7–4.0)
MCHC: 32.2 g/dL (ref 30.0–36.0)
MCV: 80.8 fl (ref 78.0–100.0)
Monocytes Absolute: 0.2 10*3/uL (ref 0.1–1.0)
Monocytes Relative: 4.6 % (ref 3.0–12.0)
Neutro Abs: 2.7 10*3/uL (ref 1.4–7.7)
Neutrophils Relative %: 54.6 % (ref 43.0–77.0)
Platelets: 388 10*3/uL (ref 150.0–400.0)
RBC: 5.34 Mil/uL — ABNORMAL HIGH (ref 3.87–5.11)
RDW: 14.5 % (ref 11.5–15.5)
WBC: 5 10*3/uL (ref 4.0–10.5)

## 2023-09-25 LAB — HEMOGLOBIN A1C: Hgb A1c MFr Bld: 6.2 % (ref 4.6–6.5)

## 2023-09-25 NOTE — Progress Notes (Signed)
 Labs stable.  Cholesterol better A1C-same-impaired sugars.  Keep working on diet/exercise

## 2023-10-18 ENCOUNTER — Ambulatory Visit: Admitting: *Deleted

## 2023-10-18 DIAGNOSIS — Z23 Encounter for immunization: Secondary | ICD-10-CM | POA: Diagnosis not present

## 2023-10-18 NOTE — Progress Notes (Signed)
 Per orders of Dr. [name], injection of Shingles Vaccine given left deltoid per patient preference by Amanda Stevens, CMA. Patient tolerated injection well.

## 2023-12-26 ENCOUNTER — Encounter: Payer: Self-pay | Admitting: Family Medicine

## 2023-12-26 ENCOUNTER — Ambulatory Visit (INDEPENDENT_AMBULATORY_CARE_PROVIDER_SITE_OTHER): Admitting: Family Medicine

## 2023-12-26 DIAGNOSIS — I10 Essential (primary) hypertension: Secondary | ICD-10-CM | POA: Diagnosis not present

## 2023-12-26 MED ORDER — OLMESARTAN MEDOXOMIL 20 MG PO TABS: 20.0000 mg | ORAL_TABLET | Freq: Every day | ORAL | 0 refills | Status: DC

## 2023-12-26 MED ORDER — AMLODIPINE BESYLATE 5 MG PO TABS: 5.0000 mg | ORAL_TABLET | Freq: Every day | ORAL | 0 refills | Status: DC

## 2023-12-26 NOTE — Progress Notes (Signed)
 Subjective:     Patient ID: Amanda Stevens, female    DOB: 02/08/1971, 53 y.o.   MRN: [id number]  Chief Complaint  Patient presents with   Medical Management of Chronic Issues    3 month follow-up Had an injury flare-up with left knee, having pain and swelling, had been exercising and walking daily    HPI H,predm Discussed the use of AI scribe software for clinical note transcription with the patient, who gave verbal consent to proceed.  History of Present Illness Amanda Stevens is a 53 year old female with hypertension who presents for a follow-up visit.  She manages her hypertension with amlodipine  5 mg and olmesartan  20 mg. She requires a refill for her medications. Her frequency of blood pressure monitoring has decreased from daily to once a month. She feels good overall and has incorporated daily walks and strength training into her routine.  She walks for an hour every morning and began strength training three weeks ago. However, she paused due to a flare-up in her left knee, which feels swollen and has a sensation of fluid accumulation. She plans to continue upper body exercises and modify her routine to avoid squats and lunges.  She previously tried tirzepatide  but discontinued it after two weeks due to severe fatigue and lethargy. She has resumed taking berberine, which she has used before, but it has not significantly affected her appetite. She feels a sense of peace and calm with her current lifestyle changes.  She reports eating healthily, with occasional indulgence in yogurt bars. Her favorite black pants fit better. Her morning walks positively impact her mental well-being and overall health.  No new surgeries. She feels good overall, with no significant concerns aside from the knee issue.    Health Maintenance Due  Topic Date Due   Hepatitis B Vaccines (1 of 3 - 19+ 3-dose series) Never done   INFLUENZA VACCINE  12/14/2023    Past Medical History:  Diagnosis Date    Blood transfusion without reported diagnosis 2013   I had a hysterectomy and lost blood   Constipation    Depression 02/22/2023   Gastroesophageal reflux disease 09/24/2023   Hypertension    Joint pain    Mixed hyperlipidemia 03/13/2023    Past Surgical History:  Procedure Laterality Date   ABDOMINAL HYSTERECTOMY  2013   fibroids   MYOMECTOMY     PARTIAL HYSTERECTOMY       Current Outpatient Medications:    Black Currant Seed Oil 500 MG CAPS, Take by mouth., Disp: , Rfl:    multivitamin-iron-minerals-folic acid (CENTRUM) chewable tablet, Chew 1 tablet by mouth daily., Disp: , Rfl:    Omega-3 1000 MG CAPS, Take by mouth., Disp: , Rfl:    Progesterone Micronized (PROGESTERONE COMPOUNDING KIT) 20 % CREA, Place onto the skin., Disp: , Rfl:    amLODipine  (NORVASC ) 5 MG tablet, Take 1 tablet (5 mg total) by mouth daily., Disp: 90 tablet, Rfl: 0   olmesartan  (BENICAR ) 20 MG tablet, Take 1 tablet (20 mg total) by mouth daily., Disp: 90 tablet, Rfl: 0  Allergies  Allergen Reactions   Egg [place] (Egg Protein) Other (See Comments)    egg [place] (chicken) allergenic extract   Flavoring Agent Other (See Comments)   Flavoring Agent (Non-Screening) Other (See Comments)   ROS neg/noncontributory except as noted HPI/below      Objective:     BP 139/88   Pulse 85   Temp 97.7 F (36.5 C) (Temporal)   Resp  18   Ht 5' 6 (1.676 m)   Wt 241 lb 6 oz (109.5 kg)   LMP  (LMP Unknown)   SpO2 96%   BMI 38.96 kg/m  Wt Readings from Last 3 Encounters:  12/26/23 241 lb 6 oz (109.5 kg)  09/24/23 240 lb 12.8 oz (109.2 kg)  04/19/23 234 lb (106.1 kg)    Physical Exam   Gen: WDWN NAD HEENT: NCAT, conjunctiva not injected, sclera nonicteric NECK:  supple, no thyromegaly, no nodes, no carotid bruits CARDIAC: RRR, S1S2+, no murmur. DP 2+B EXT:  no edema MSK: no gross abnormalities.  NEURO: A&O x3.  CN II-XII intact.  PSYCH: normal mood. Good eye contact     Assessment & Plan:   Essential hypertension -     amLODIPine  Besylate; Take 1 tablet (5 mg total) by mouth daily.  Dispense: 90 tablet; Refill: 0 -     Olmesartan  Medoxomil; Take 1 tablet (20 mg total) by mouth daily.  Dispense: 90 tablet; Refill: 0  Assessment and Plan Assessment & Plan Hypertension   Hypertension is managed with amlodipine  5 mg and olmesartan  20 mg.  Lifestyle modifications, including daily walking and strength training, are being implemented.. Continue amlodipine  5 mg and olmesartan  20 mg. Encourage blood pressure monitoring at least once a month. Continue lifestyle modifications. Reassess blood pressure control in 3 months.  Obesity   Obesity management includes lifestyle modifications such as daily walking and strength training. She reports a positive mindset and feels at peace with her regimen. Previous use of tirzepatide  was discontinued due to adverse effects, and she is currently taking berberine without significant impact on appetite. Continue daily walking and strength training. Encourage a balanced diet and healthy eating habits. Reassess weight management strategies in 3 months.  Left knee pain and swelling   Left knee pain and swelling have been exacerbated by strength training activities, particularly squats. She reports swelling and a different sensation in the knee, possibly indicating fluid accumulation. She plans to modify her exercise routine to avoid aggravating the knee. Advise rest and modification of exercise routine to avoid squats and lunges. Consider upper body exercises to maintain fitness. Monitor knee symptoms and reassess if symptoms persist.  Adverse reaction to tirzepatide    She experienced severe fatigue and lethargy while on tirzepatide , leading to discontinuation. The reaction significantly impacted daily functioning, and she has decided not to continue with tirzepatide . Avoid use of tirzepatide  due to adverse reaction.    Return in about 3 months (around  03/27/2024) for HTN.  Jenkins CHRISTELLA Carrel, MD

## 2023-12-26 NOTE — Patient Instructions (Signed)
 It was very nice to see you today!  Keep up the great work   PLEASE NOTE:  If you had any lab tests please let us  know if you have not heard back within a few days. You may see your results on MyChart before we have a chance to review them but we will give you a call once they are reviewed by us . If we ordered any referrals today, please let us  know if you have not heard from their office within the next week.   Please try these tips to maintain a healthy lifestyle:  Eat most of your calories during the day when you are active. Eliminate processed foods including packaged sweets (pies, cakes, cookies), reduce intake of potatoes, Feldhaus bread, Chieffo pasta, and Sramek rice. Look for whole grain options, oat flour or almond flour.  Each meal should contain half fruits/vegetables, one quarter protein, and one quarter carbs (no bigger than a computer mouse).  Cut down on sweet beverages. This includes juice, soda, and sweet tea. Also watch fruit intake, though this is a healthier sweet option, it still contains natural sugar! Limit to 3 servings daily.  Drink at least 1 glass of water with each meal and aim for at least 8 glasses per day  Exercise at least 150 minutes every week.

## 2024-01-01 LAB — HM MAMMOGRAPHY

## 2024-03-28 ENCOUNTER — Ambulatory Visit: Admitting: Family Medicine

## 2024-03-28 ENCOUNTER — Encounter: Payer: Self-pay | Admitting: Family Medicine

## 2024-03-28 VITALS — BP 132/84 | HR 78 | Temp 97.7°F | Ht 66.0 in | Wt 249.5 lb

## 2024-03-28 DIAGNOSIS — Z23 Encounter for immunization: Secondary | ICD-10-CM | POA: Diagnosis not present

## 2024-03-28 DIAGNOSIS — I10 Essential (primary) hypertension: Secondary | ICD-10-CM

## 2024-03-28 DIAGNOSIS — R7303 Prediabetes: Secondary | ICD-10-CM | POA: Diagnosis not present

## 2024-03-28 DIAGNOSIS — E782 Mixed hyperlipidemia: Secondary | ICD-10-CM | POA: Diagnosis not present

## 2024-03-28 DIAGNOSIS — Z6841 Body Mass Index (BMI) 40.0 and over, adult: Secondary | ICD-10-CM

## 2024-03-28 DIAGNOSIS — N1831 Chronic kidney disease, stage 3a: Secondary | ICD-10-CM

## 2024-03-28 MED ORDER — AMLODIPINE BESYLATE 5 MG PO TABS: 5.0000 mg | ORAL_TABLET | Freq: Every day | ORAL | 1 refills | Status: AC

## 2024-03-28 MED ORDER — OLMESARTAN MEDOXOMIL 20 MG PO TABS: 20.0000 mg | ORAL_TABLET | Freq: Every day | ORAL | 1 refills | Status: AC

## 2024-03-28 NOTE — Progress Notes (Signed)
 Subjective:     Patient ID: Amanda Stevens, female    DOB: 1971/03/13, 53 y.o.   MRN: [id number]  Chief Complaint  Patient presents with   Hypertension    Pt is here to go over chronic issues    Discussed the use of AI scribe software for clinical note transcription with the patient, who gave verbal consent to proceed.  History of Present Illness Amanda Stevens is a 53 year old female with hypertension and prediabetes who presents for blood pressure management and weight concerns.  She is currently taking amlodipine  5 mg and olmesartan  20 mg for hypertension. Her home blood pressure readings are typically in the 120s/80s range, although it was slightly elevated today. No headaches, dizziness, chest pain, shortness of breath, or leg swelling.  She is addressing weight management issues, which she believes are related to her blood pressure and menopause. She started a GLP-1 medication three weeks ago, obtained from a compound pharmacy, and is in regular contact with a nurse practitioner for support. Initial weight loss was from 249-250 lbs to 246 lbs, but she has noticed some weight gain again, attributed to water retention and muscle changes. She is committed to continuing the medication for a year to help regulate insulin  and manage weight.  Her physical activity includes daily walking, although she has reduced her strength training and high-intensity interval training due to fatigue since starting the GLP-1. She remains active and enjoys exercise, including Zumba, and is involved in her company's [employer] program for weight management.  Her cholesterol levels have been a concern, with a recent total cholesterol of 287 mg/dL and LDL of 813 mg/dL. She has a history of elevated cholesterol, previously reaching 324 mg/dL, which she managed to reduce through diet changes. There is a possible hereditary component to her hyperlipidemia.  Recent lab results from a biometric screening include a  glucose level of 89 mg/dL, BUN of 22 mg/dL, creatinine of 8.88 mg/dL, GFR of 59 fO/fpw/8.26f, and hemoglobin A1c of 6.0%. She has a GFR under 60 and is focused on maintaining controlled blood pressure and blood sugar levels.    Health Maintenance Due  Topic Date Due   Hepatitis B Vaccines 19-59 Average Risk (1 of 3 - 19+ 3-dose series) Never done   Influenza Vaccine  12/14/2023    Past Medical History:  Diagnosis Date   Blood transfusion without reported diagnosis 2013   I had a hysterectomy and lost blood   Constipation    Depression 02/22/2023   Gastroesophageal reflux disease 09/24/2023   Hypertension    Joint pain    Mixed hyperlipidemia 03/13/2023    Past Surgical History:  Procedure Laterality Date   ABDOMINAL HYSTERECTOMY  2013   fibroids   MYOMECTOMY     PARTIAL HYSTERECTOMY       Current Outpatient Medications:    Black Currant Seed Oil 500 MG CAPS, Take by mouth., Disp: , Rfl:    multivitamin-iron-minerals-folic acid (CENTRUM) chewable tablet, Chew 1 tablet by mouth daily., Disp: , Rfl:    Omega-3 1000 MG CAPS, Take by mouth., Disp: , Rfl:    Progesterone Micronized (PROGESTERONE COMPOUNDING KIT) 20 % CREA, Place onto the skin., Disp: , Rfl:    SEMAGLUTIDE ,0.25 OR 0.5MG /DOS, [place], Inject into the skin., Disp: , Rfl:    amLODipine  (NORVASC ) 5 MG tablet, Take 1 tablet (5 mg total) by mouth daily., Disp: 90 tablet, Rfl: 1   olmesartan  (BENICAR ) 20 MG tablet, Take 1 tablet (20 mg total)  by mouth daily., Disp: 90 tablet, Rfl: 1  Allergies  Allergen Reactions   Egg Protein (Egg Corvino) Other (See Comments)    egg Forsee (chicken) allergenic extract   Flavoring Agent Other (See Comments)   Flavoring Agent (Non-Screening) Other (See Comments)   ROS neg/noncontributory except as noted HPI/below      Objective:     BP 132/84 (BP Location: Left Arm, Patient Position: Sitting, Cuff Size: Large)   Pulse 78   Temp 97.7 F (36.5 C) (Temporal)   Ht 5' 6 (1.676 m)    Wt 249 lb 8 oz (113.2 kg)   LMP  (LMP Unknown)   SpO2 98%   BMI 40.27 kg/m  Wt Readings from Last 3 Encounters:  03/28/24 249 lb 8 oz (113.2 kg)  12/26/23 241 lb 6 oz (109.5 kg)  09/24/23 240 lb 12.8 oz (109.2 kg)    Physical Exam GENERAL: Well developed well nourished no acute distress HEAD EYES EARS NOSE THROAT: Normocephalic atraumatic, conjunctiva not injected, sclera nonicteric CARDIAC: Regular rate and rhythm, S1 S2 present , no murmur, dorsalis pedis 2 plus bilaterally NECK: Supple, no thyromegaly, no nodes, no carotid bruits LUNGS: Clear to auscultation bilaterally, no wheezes ABDOMEN: Bowel sounds present, soft, non tender non distended, no hepatosplenomegaly, no masses EXTREMITIES: No edema MUSCULOSKELETAL: No gross abnormalities NEUROLOGICAL: Alert and oriented x3, cranial nerves II through XII intact PSYCHIATRIC: Normal mood, good eye contact   Results LABS Glucose: 89 mg/dL (89/70/7974) BUN: 22 mg/dL (89/70/7974) Creatinine: 1.11 mg/dL (89/70/7974) GFR: 59 fO/fpw/8.26 m (03/12/2024) Sodium: 140 mmol/L (03/12/2024) Potassium: 4.4 mmol/L (03/12/2024) Chloride: 103 mmol/L (03/12/2024) Bicarbonate: 19 mmol/L (03/12/2024) Calcium: 9.9 mg/dL (89/70/7974) Total Protein: 7.6 g/dL (89/70/7974) Albumin: 4.5 g/dL (89/70/7974) Bilirubin: 0.4 mg/dL (89/70/7974) Alkaline Phosphatase: 72 U/L (03/12/2024) AST: 31 U/L (03/12/2024) ALT: 30 U/L (03/12/2024) Total Cholesterol: 287 mg/dL (89/70/7974) Triglycerides: 240 mg/dL (89/70/7974) HDL: 55 mg/dL (89/70/7974) LDL: 813 mg/dL (89/70/7974) Hemoglobin A1c: 6% (03/12/2024) TSH: 1.7 mIU/L (03/12/2024)      Assessment & Plan:  Prediabetes  Essential hypertension -     amLODIPine  Besylate; Take 1 tablet (5 mg total) by mouth daily.  Dispense: 90 tablet; Refill: 1 -     Olmesartan  Medoxomil; Take 1 tablet (20 mg total) by mouth daily.  Dispense: 90 tablet; Refill: 1  Mixed hyperlipidemia  Stage 3a chronic kidney disease  (HCC)  Morbid obesity with BMI of 40.0-44.9, adult (HCC)    Assessment and Plan Assessment & Plan Essential hypertension   Blood pressure is well controlled with amlodipine  5 mg and olmesartan  20 mg, with home readings consistently in the 120s/80s range and occasional spikes. She reports no symptoms such as headache, dizziness, chest pain, shortness of breath, or leg swelling. Continue amlodipine  5 mg and olmesartan  20 mg daily.  Morbid obesity due to excess calories   Currently on semaglutide  for weight management. Initial weight was 249 lbs, decreased to 246 lbs, but has slightly increased. She reports fatigue and reduced exercise intensity since starting GLP-1. Engages in daily walking but has reduced strength training and HIIT. Motivated to continue lifestyle changes and GLP-1 therapy for a year. Semaglutide  may take several months to show significant weight loss effects and offers kidney protection and potential benefits for heart disease and sleep apnea. Continue semaglutide  therapy, daily walking, and maintain current exercise regimen as tolerated. Discussed potential long-term benefits of semaglutide , including kidney protection. Encouraged pt to keep on task.  Mixed hyperlipidemia   Cholesterol levels have fluctuated, with recent total cholesterol at  287 mg/dL and LDL at 813 mg/dL. HDL is at 55 mg/dL. Previous dietary changes reduced LDL from 223 mg/dL to 874 mg/dL. Current LDL is above the goal of less than 130 mg/dL. No medication initiated due to current LDL level and absence of diabetes. Continue dietary modifications to manage cholesterol levels.  Prediabetes   Hemoglobin A1c is 6.0%, indicating prediabetes, down from a previous 6.2%. Significant lifestyle changes have been made, including dietary modifications and increased physical activity. Semaglutide  therapy may aid in further glycemic control. Continue lifestyle modifications including diet and exercise, and continue semaglutide   therapy.  Chronic kidney disease, stage 3a   GFR is 59 mL/min/1.73 m2, indicating stage 3a chronic kidney disease. Blood pressure and blood sugar control are crucial for kidney health. Semaglutide  may provide renal protective benefits. Continue blood pressure management with the current regimen and blood sugar control with lifestyle modifications and semaglutide .     Return in about 6 months (around 09/25/2024) for HTN, cholesterol.  Jenkins CHRISTELLA Carrel, MD

## 2024-03-28 NOTE — Patient Instructions (Signed)

## 2024-05-19 ENCOUNTER — Telehealth: Payer: Self-pay | Admitting: Family Medicine

## 2024-05-19 NOTE — Telephone Encounter (Signed)
 Copied from CRM #[id number]. Topic: General - Billing Inquiry >> May 16, 2024  9:55 AM Hadassah PARAS wrote: Reason for CRM: Pt has spoke with Rufus from billing department and was advised she was possibly double billed last summer for flu shot. There has been confusion on this visit as she is normally charged 175 instead of 230. Pt would like clarity on the way this visit was coded. Please advise pt on #[phone]

## 2024-05-19 NOTE — Telephone Encounter (Signed)
 Looking into this. Will call pt once we have an answer.
